# Patient Record
Sex: Female | Born: 1955 | Race: White | Hispanic: No | Marital: Single | Smoking: Never smoker
Health system: Southern US, Community
[De-identification: ages and names within clinical notes are randomized; demographics above are authoritative.]

## PROBLEM LIST (undated history)

## (undated) DIAGNOSIS — M1990 Unspecified osteoarthritis, unspecified site: Secondary | ICD-10-CM

## (undated) DIAGNOSIS — G709 Myoneural disorder, unspecified: Secondary | ICD-10-CM

## (undated) DIAGNOSIS — C4491 Basal cell carcinoma of skin, unspecified: Secondary | ICD-10-CM

## (undated) DIAGNOSIS — C801 Malignant (primary) neoplasm, unspecified: Secondary | ICD-10-CM

## (undated) DIAGNOSIS — R519 Headache, unspecified: Secondary | ICD-10-CM

## (undated) DIAGNOSIS — D649 Anemia, unspecified: Secondary | ICD-10-CM

## (undated) DIAGNOSIS — K219 Gastro-esophageal reflux disease without esophagitis: Secondary | ICD-10-CM

## (undated) DIAGNOSIS — R569 Unspecified convulsions: Secondary | ICD-10-CM

## (undated) DIAGNOSIS — Z8041 Family history of malignant neoplasm of ovary: Secondary | ICD-10-CM

## (undated) HISTORY — DX: Family history of malignant neoplasm of ovary: Z80.41

---

## 2004-03-08 ENCOUNTER — Encounter: Admission: RE | Admit: 2004-03-08 | Discharge: 2004-03-08 | Payer: Self-pay | Admitting: Family Medicine

## 2004-11-23 ENCOUNTER — Encounter: Admission: RE | Admit: 2004-11-23 | Discharge: 2004-11-23 | Payer: Self-pay | Admitting: Family Medicine

## 2004-12-22 ENCOUNTER — Other Ambulatory Visit: Admission: RE | Admit: 2004-12-22 | Discharge: 2004-12-22 | Payer: Self-pay | Admitting: Obstetrics and Gynecology

## 2008-03-09 ENCOUNTER — Encounter: Admission: RE | Admit: 2008-03-09 | Discharge: 2008-03-09 | Payer: Self-pay | Admitting: Family Medicine

## 2009-05-31 ENCOUNTER — Encounter: Admission: RE | Admit: 2009-05-31 | Discharge: 2009-05-31 | Payer: Self-pay | Admitting: Family Medicine

## 2010-06-04 ENCOUNTER — Encounter: Payer: Self-pay | Admitting: Family Medicine

## 2013-12-01 ENCOUNTER — Other Ambulatory Visit: Payer: Self-pay | Admitting: Family Medicine

## 2013-12-01 ENCOUNTER — Other Ambulatory Visit (HOSPITAL_COMMUNITY)
Admission: RE | Admit: 2013-12-01 | Discharge: 2013-12-01 | Disposition: A | Discharge status: Home / Self Care | Payer: BC Managed Care – PPO | Source: Ambulatory Visit | Attending: Family Medicine | Admitting: Family Medicine

## 2013-12-01 DIAGNOSIS — Z124 Encounter for screening for malignant neoplasm of cervix: Secondary | ICD-10-CM | POA: Insufficient documentation

## 2013-12-03 LAB — CYTOLOGY - PAP

## 2014-08-25 ENCOUNTER — Other Ambulatory Visit: Payer: Self-pay

## 2014-08-25 DIAGNOSIS — Z1231 Encounter for screening mammogram for malignant neoplasm of breast: Secondary | ICD-10-CM

## 2014-09-04 ENCOUNTER — Ambulatory Visit: Payer: BC Managed Care – PPO

## 2014-09-24 ENCOUNTER — Ambulatory Visit
Admission: RE | Admit: 2014-09-24 | Discharge: 2014-09-24 | Disposition: A | Discharge status: Home / Self Care | Payer: BC Managed Care – PPO | Source: Ambulatory Visit

## 2014-09-24 DIAGNOSIS — Z1231 Encounter for screening mammogram for malignant neoplasm of breast: Secondary | ICD-10-CM

## 2014-09-28 ENCOUNTER — Other Ambulatory Visit: Payer: Self-pay | Admitting: Family Medicine

## 2014-09-28 DIAGNOSIS — R928 Other abnormal and inconclusive findings on diagnostic imaging of breast: Secondary | ICD-10-CM

## 2014-10-05 ENCOUNTER — Ambulatory Visit
Admission: RE | Admit: 2014-10-05 | Discharge: 2014-10-05 | Disposition: A | Discharge status: Home / Self Care | Payer: BC Managed Care – PPO | Source: Ambulatory Visit | Attending: Family Medicine | Admitting: Family Medicine

## 2014-10-05 DIAGNOSIS — R928 Other abnormal and inconclusive findings on diagnostic imaging of breast: Secondary | ICD-10-CM

## 2015-04-14 ENCOUNTER — Other Ambulatory Visit: Payer: Self-pay | Admitting: Family Medicine

## 2015-04-14 DIAGNOSIS — R921 Mammographic calcification found on diagnostic imaging of breast: Secondary | ICD-10-CM

## 2015-04-29 ENCOUNTER — Ambulatory Visit
Admission: RE | Admit: 2015-04-29 | Discharge: 2015-04-29 | Disposition: A | Discharge status: Home / Self Care | Payer: BC Managed Care – PPO | Source: Ambulatory Visit | Attending: Family Medicine | Admitting: Family Medicine

## 2015-04-29 DIAGNOSIS — R921 Mammographic calcification found on diagnostic imaging of breast: Secondary | ICD-10-CM

## 2016-04-04 ENCOUNTER — Ambulatory Visit
Admission: RE | Admit: 2016-04-04 | Discharge: 2016-04-04 | Disposition: A | Discharge status: Home / Self Care | Payer: BC Managed Care – PPO | Source: Ambulatory Visit | Attending: Family Medicine | Admitting: Family Medicine

## 2016-04-04 ENCOUNTER — Other Ambulatory Visit: Payer: Self-pay | Admitting: Family Medicine

## 2016-04-04 DIAGNOSIS — M778 Other enthesopathies, not elsewhere classified: Secondary | ICD-10-CM

## 2016-04-04 DIAGNOSIS — M7582 Other shoulder lesions, left shoulder: Principal | ICD-10-CM

## 2016-05-09 ENCOUNTER — Other Ambulatory Visit: Payer: Self-pay | Admitting: Family Medicine

## 2016-05-09 ENCOUNTER — Ambulatory Visit
Admission: RE | Admit: 2016-05-09 | Discharge: 2016-05-09 | Disposition: A | Discharge status: Home / Self Care | Payer: BC Managed Care – PPO | Source: Ambulatory Visit | Attending: Family Medicine | Admitting: Family Medicine

## 2016-05-09 DIAGNOSIS — M25562 Pain in left knee: Secondary | ICD-10-CM

## 2017-01-09 ENCOUNTER — Other Ambulatory Visit: Payer: Self-pay | Admitting: Nurse Practitioner

## 2017-01-09 ENCOUNTER — Other Ambulatory Visit (HOSPITAL_COMMUNITY)
Admission: RE | Admit: 2017-01-09 | Discharge: 2017-01-09 | Disposition: A | Discharge status: Home / Self Care | Payer: BC Managed Care – PPO | Source: Ambulatory Visit | Attending: Nurse Practitioner | Admitting: Nurse Practitioner

## 2017-01-09 DIAGNOSIS — Z124 Encounter for screening for malignant neoplasm of cervix: Secondary | ICD-10-CM | POA: Insufficient documentation

## 2017-01-12 LAB — CYTOLOGY - PAP: HPV: NOT DETECTED

## 2017-01-31 ENCOUNTER — Other Ambulatory Visit: Payer: Self-pay | Admitting: Nurse Practitioner

## 2017-03-15 ENCOUNTER — Encounter: Payer: Self-pay | Admitting: Family Medicine

## 2017-03-20 ENCOUNTER — Ambulatory Visit: Payer: BC Managed Care – PPO | Admitting: Family Medicine

## 2017-03-20 DIAGNOSIS — G8929 Other chronic pain: Secondary | ICD-10-CM

## 2017-03-20 DIAGNOSIS — M25561 Pain in right knee: Secondary | ICD-10-CM | POA: Diagnosis not present

## 2017-03-20 DIAGNOSIS — M7741 Metatarsalgia, right foot: Secondary | ICD-10-CM | POA: Diagnosis not present

## 2017-03-20 DIAGNOSIS — M25562 Pain in left knee: Secondary | ICD-10-CM

## 2017-03-20 DIAGNOSIS — M7742 Metatarsalgia, left foot: Secondary | ICD-10-CM

## 2017-03-20 MED ORDER — METHYLPREDNISOLONE ACETATE 40 MG/ML IJ SUSP
40.0000 mg | Freq: Once | INTRAMUSCULAR | Status: AC
  Administered 2017-03-20: 40 mg via INTRA_ARTICULAR

## 2017-03-20 NOTE — Assessment & Plan Note (Signed)
Significant loss of transverse arch. We'll start her with some metatarsal pads see her back in 3-4 weeks.
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
## 2017-03-20 NOTE — Assessment & Plan Note (Signed)
I suspect she has some degenerative meniscal issues plus minus mild DJD. There is a report in her chart of tricompartmental arthritis of the right knee but I don't see any x-ray images. The left knee images revealed only some mild medial compartment narrowing We discussed options. We'll do corticosteroid injection left knee today and see her back 3-4 weeks. Use  the right knee as a control.

## 2017-04-24 ENCOUNTER — Ambulatory Visit: Payer: BC Managed Care – PPO | Admitting: Family Medicine

## 2017-05-15 DIAGNOSIS — Z9289 Personal history of other medical treatment: Secondary | ICD-10-CM

## 2017-05-15 HISTORY — DX: Personal history of other medical treatment: Z92.89

## 2017-10-03 ENCOUNTER — Other Ambulatory Visit: Payer: Self-pay | Admitting: Family Medicine

## 2017-10-03 DIAGNOSIS — R928 Other abnormal and inconclusive findings on diagnostic imaging of breast: Secondary | ICD-10-CM

## 2017-10-24 ENCOUNTER — Other Ambulatory Visit: Payer: BC Managed Care – PPO

## 2019-07-12 ENCOUNTER — Ambulatory Visit: Payer: BC Managed Care – PPO | Attending: Internal Medicine

## 2019-07-12 DIAGNOSIS — Z23 Encounter for immunization: Secondary | ICD-10-CM | POA: Insufficient documentation

## 2019-07-12 NOTE — Progress Notes (Signed)
   Covid-19 Vaccination Clinic  Name:  Melanie Reese    MRN: [id number] DOB: August 14, 1955  07/12/2019  Melanie Reese was observed post Covid-19 immunization for 15 minutes without incidence. She was provided with Vaccine Information Sheet and instruction to access the V-Safe system.   Melanie Reese was instructed to call 911 with any severe reactions post vaccine: Marland Kitchen Difficulty breathing  . Swelling of your face and throat  . A fast heartbeat  . A bad rash all over your body  . Dizziness and weakness    Immunizations Administered    Name Date Dose VIS Date Route   Pfizer COVID-19 Vaccine 07/12/2019  2:10 PM 0.3 mL 04/25/2019 Intramuscular   Manufacturer: [employer], [employer]   Lot: [id number]   NDC: [address]-3

## 2019-08-02 ENCOUNTER — Ambulatory Visit: Payer: BC Managed Care – PPO | Attending: Internal Medicine

## 2019-08-02 DIAGNOSIS — Z23 Encounter for immunization: Secondary | ICD-10-CM

## 2019-08-02 NOTE — Progress Notes (Signed)
   Covid-19 Vaccination Clinic  Name:  Melanie Reese    MRN: [id number] DOB: November 09, 1955  08/02/2019  Melanie Reese was observed post Covid-19 immunization for 15 minutes without incident. She was provided with Vaccine Information Sheet and instruction to access the V-Safe system.   Melanie Reese was instructed to call 911 with any severe reactions post vaccine: Marland Kitchen Difficulty breathing  . Swelling of face and throat  . A fast heartbeat  . A bad rash all over body  . Dizziness and weakness   Immunizations Administered    Name Date Dose VIS Date Route   Pfizer COVID-19 Vaccine 08/02/2019 10:05 AM 0.3 mL 04/25/2019 Intramuscular   Manufacturer: [employer], [employer]   Lot: [id number]   NDC: [address]-7

## 2020-05-25 ENCOUNTER — Other Ambulatory Visit: Payer: Self-pay | Admitting: Ophthalmology

## 2020-10-21 ENCOUNTER — Other Ambulatory Visit: Payer: Self-pay | Admitting: Family Medicine

## 2020-10-21 DIAGNOSIS — Z1231 Encounter for screening mammogram for malignant neoplasm of breast: Secondary | ICD-10-CM

## 2020-12-15 ENCOUNTER — Ambulatory Visit
Admission: RE | Admit: 2020-12-15 | Discharge: 2020-12-15 | Disposition: A | Discharge status: Home / Self Care | Payer: BC Managed Care – PPO | Source: Ambulatory Visit | Attending: Family Medicine | Admitting: Family Medicine

## 2020-12-15 ENCOUNTER — Other Ambulatory Visit: Payer: Self-pay

## 2020-12-15 DIAGNOSIS — Z1231 Encounter for screening mammogram for malignant neoplasm of breast: Secondary | ICD-10-CM

## 2023-01-29 ENCOUNTER — Other Ambulatory Visit: Payer: Self-pay | Admitting: Family Medicine

## 2023-01-29 DIAGNOSIS — M8580 Other specified disorders of bone density and structure, unspecified site: Secondary | ICD-10-CM

## 2023-01-29 IMAGING — MG MM DIGITAL SCREENING BILAT W/ TOMO AND CAD
8 series · 9 of 24 positions shown · non-contrast
Comparison: Previous exam(s).

CLINICAL DATA: Screening.

EXAM:
DIGITAL SCREENING BILATERAL MAMMOGRAM WITH TOMOSYNTHESIS AND CAD
TECHNIQUE: Bilateral screening digital craniocaudal and mediolateral oblique
mammograms were obtained. Bilateral screening digital breast
tomosynthesis was performed. The images were evaluated with
computer-aided detection.

[L MLO synth-2D]
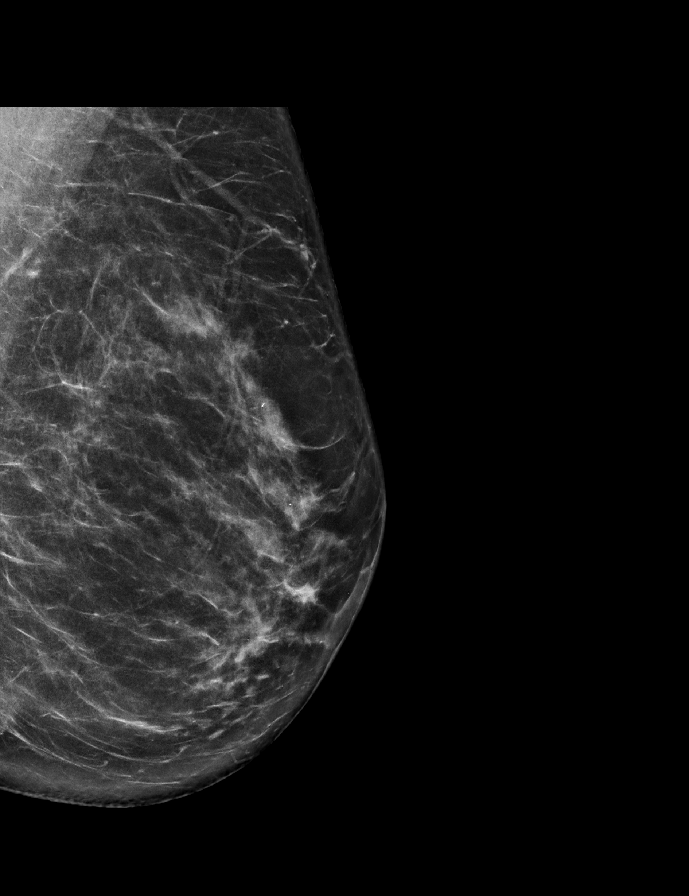

[L CC synth-2D]
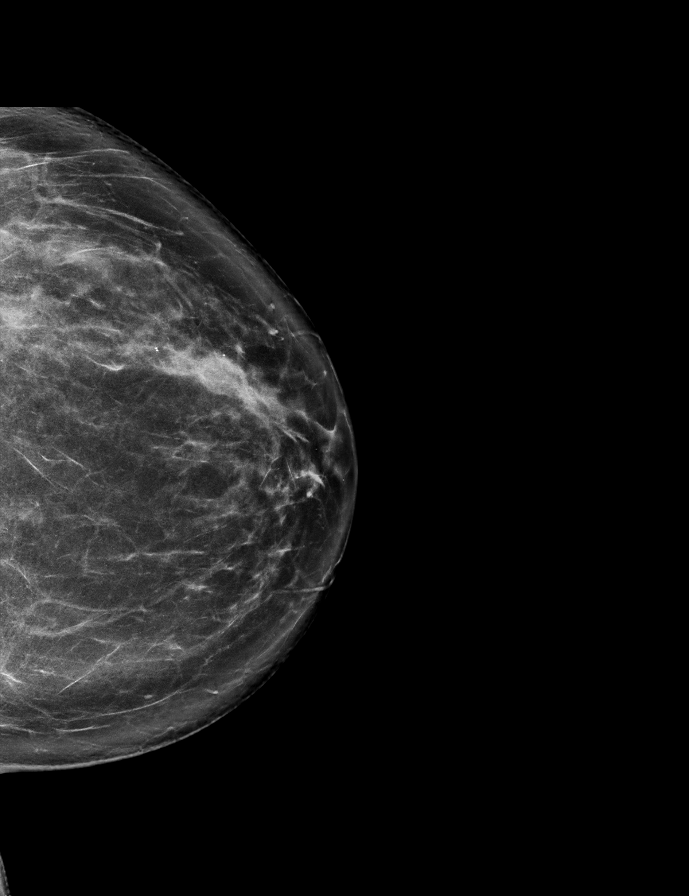

[R MLO synth-2D]
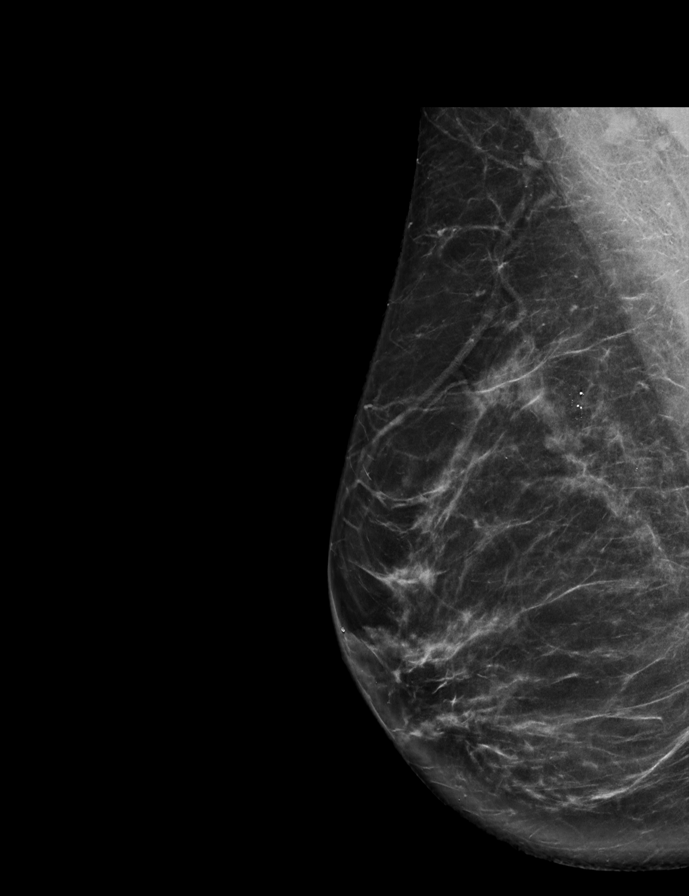

[R CC synth-2D]
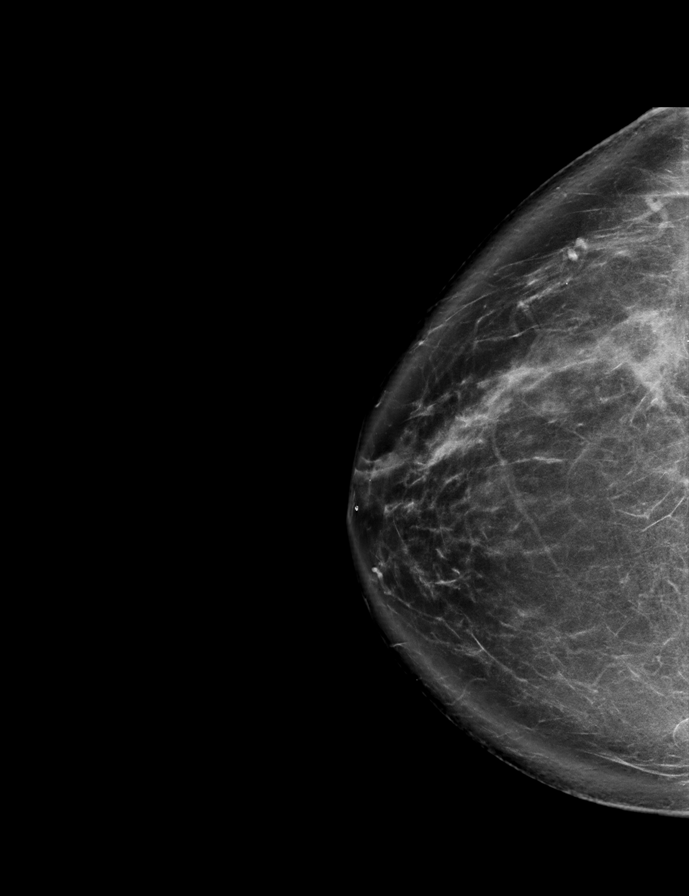

[R CC tomo · 2 of 86 frames shown]
[frame 28/86]
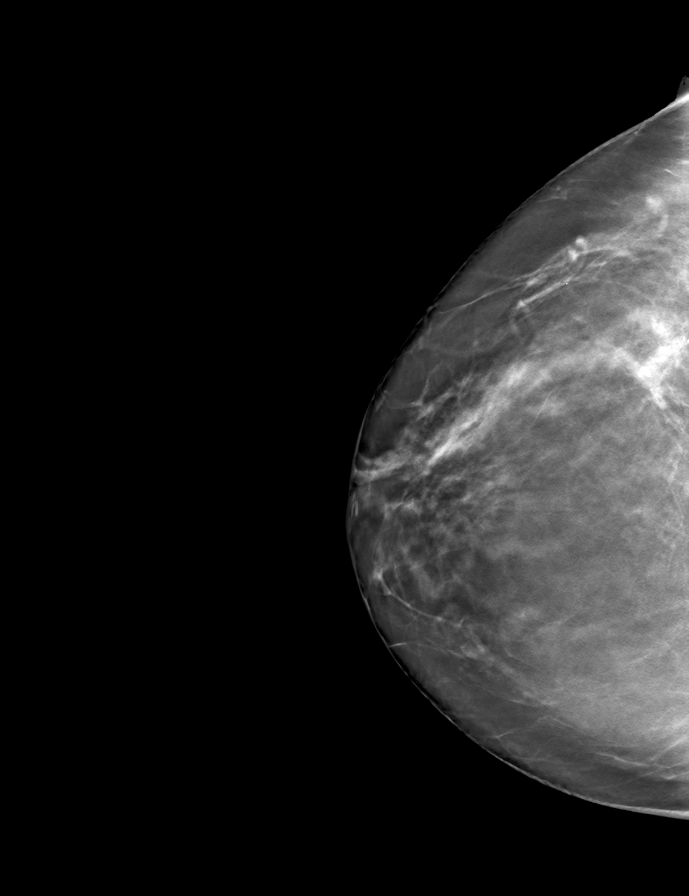
[frame 43/86]
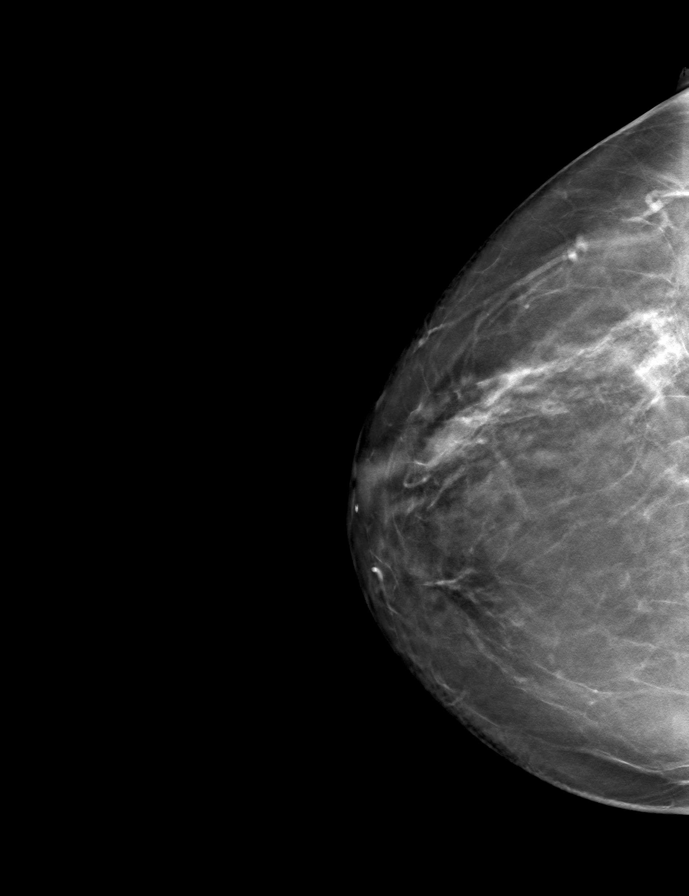

[L MLO tomo · tomo slice 37/73.0]
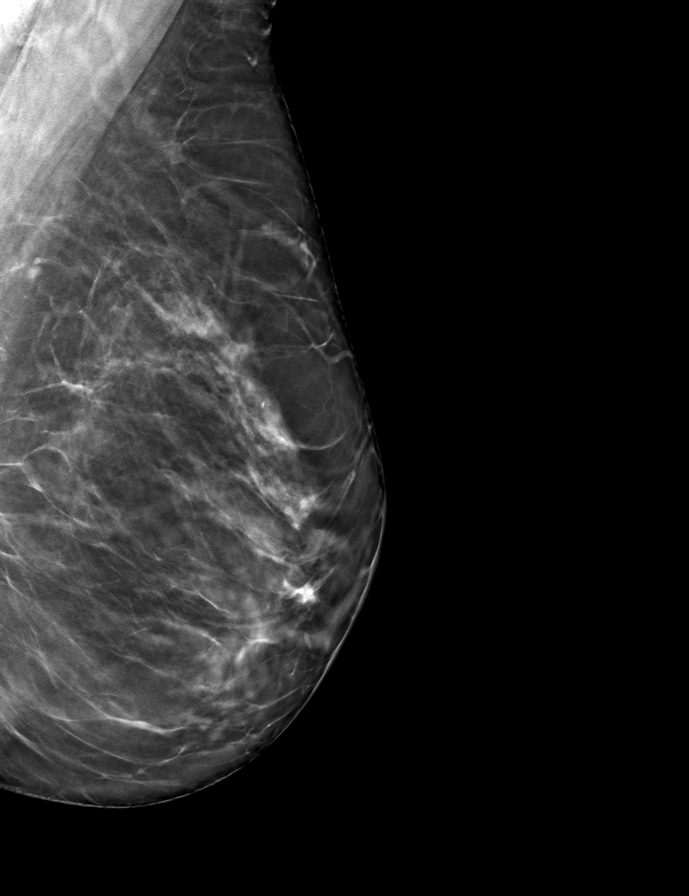

[R MLO tomo · tomo slice 39/76.0]
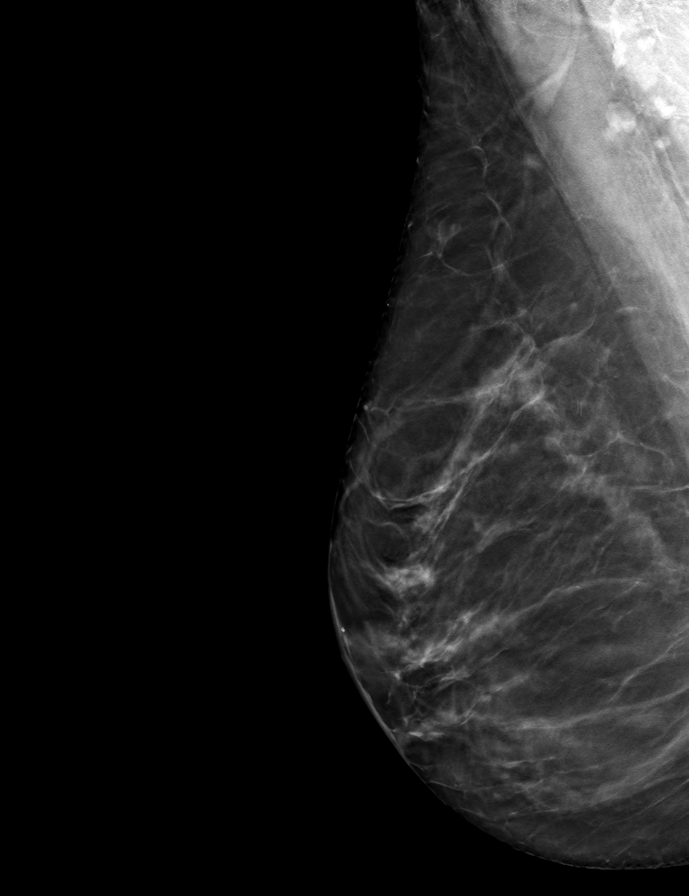

[L CC tomo · tomo slice 40/79.0]
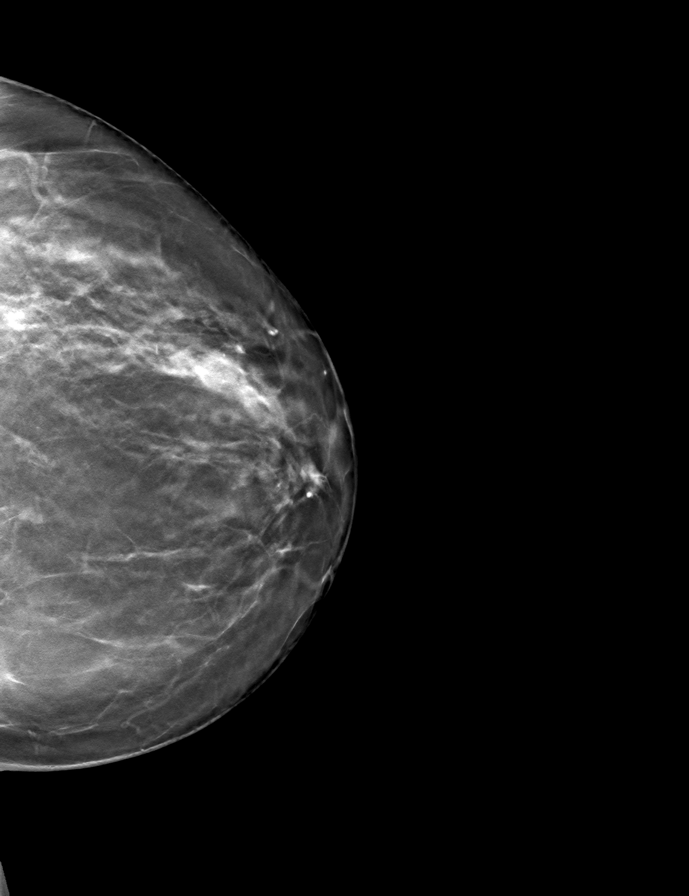

[9 of 24 positions shown; findings below may reference images not displayed]

ACR Breast Density Category b: There are scattered areas of
fibroglandular density.
FINDINGS: There are no findings suspicious for malignancy.
IMPRESSION: No mammographic evidence of malignancy. A result letter of this
screening mammogram will be mailed directly to the patient.

RECOMMENDATION:
Screening mammogram in one year. (Code:51-O-LD2)

BI-RADS CATEGORY  1: Negative.

## 2023-09-13 DIAGNOSIS — Z87891 Personal history of nicotine dependence: Secondary | ICD-10-CM | POA: Diagnosis not present

## 2023-09-13 DIAGNOSIS — G40909 Epilepsy, unspecified, not intractable, without status epilepticus: Secondary | ICD-10-CM | POA: Diagnosis not present

## 2023-09-13 DIAGNOSIS — K219 Gastro-esophageal reflux disease without esophagitis: Secondary | ICD-10-CM | POA: Diagnosis not present

## 2023-09-13 DIAGNOSIS — Z8249 Family history of ischemic heart disease and other diseases of the circulatory system: Secondary | ICD-10-CM | POA: Diagnosis not present

## 2023-09-13 DIAGNOSIS — R32 Unspecified urinary incontinence: Secondary | ICD-10-CM | POA: Diagnosis not present

## 2023-09-13 DIAGNOSIS — Z833 Family history of diabetes mellitus: Secondary | ICD-10-CM | POA: Diagnosis not present

## 2023-09-13 DIAGNOSIS — M1990 Unspecified osteoarthritis, unspecified site: Secondary | ICD-10-CM | POA: Diagnosis not present

## 2023-09-13 DIAGNOSIS — I10 Essential (primary) hypertension: Secondary | ICD-10-CM | POA: Diagnosis not present

## 2023-09-13 DIAGNOSIS — F325 Major depressive disorder, single episode, in full remission: Secondary | ICD-10-CM | POA: Diagnosis not present

## 2023-09-17 ENCOUNTER — Other Ambulatory Visit: Payer: BC Managed Care – PPO

## 2023-09-17 DIAGNOSIS — L820 Inflamed seborrheic keratosis: Secondary | ICD-10-CM | POA: Diagnosis not present

## 2023-09-17 DIAGNOSIS — Z85828 Personal history of other malignant neoplasm of skin: Secondary | ICD-10-CM | POA: Diagnosis not present

## 2023-09-17 DIAGNOSIS — L700 Acne vulgaris: Secondary | ICD-10-CM | POA: Diagnosis not present

## 2023-09-17 DIAGNOSIS — L570 Actinic keratosis: Secondary | ICD-10-CM | POA: Diagnosis not present

## 2023-09-20 DIAGNOSIS — M170 Bilateral primary osteoarthritis of knee: Secondary | ICD-10-CM | POA: Diagnosis not present

## 2023-09-20 DIAGNOSIS — M62838 Other muscle spasm: Secondary | ICD-10-CM | POA: Diagnosis not present

## 2023-09-20 DIAGNOSIS — M25519 Pain in unspecified shoulder: Secondary | ICD-10-CM | POA: Diagnosis not present

## 2023-10-25 DIAGNOSIS — M25561 Pain in right knee: Secondary | ICD-10-CM | POA: Diagnosis not present

## 2023-10-25 DIAGNOSIS — M25562 Pain in left knee: Secondary | ICD-10-CM | POA: Diagnosis not present

## 2023-10-25 DIAGNOSIS — M25511 Pain in right shoulder: Secondary | ICD-10-CM | POA: Diagnosis not present

## 2023-10-25 DIAGNOSIS — M25512 Pain in left shoulder: Secondary | ICD-10-CM | POA: Diagnosis not present

## 2023-10-30 DIAGNOSIS — M25511 Pain in right shoulder: Secondary | ICD-10-CM | POA: Diagnosis not present

## 2023-10-30 DIAGNOSIS — M25512 Pain in left shoulder: Secondary | ICD-10-CM | POA: Diagnosis not present

## 2023-10-30 DIAGNOSIS — M25562 Pain in left knee: Secondary | ICD-10-CM | POA: Diagnosis not present

## 2023-10-30 DIAGNOSIS — M25561 Pain in right knee: Secondary | ICD-10-CM | POA: Diagnosis not present

## 2023-11-01 DIAGNOSIS — M25511 Pain in right shoulder: Secondary | ICD-10-CM | POA: Diagnosis not present

## 2023-11-01 DIAGNOSIS — M25561 Pain in right knee: Secondary | ICD-10-CM | POA: Diagnosis not present

## 2023-11-01 DIAGNOSIS — M25562 Pain in left knee: Secondary | ICD-10-CM | POA: Diagnosis not present

## 2023-11-01 DIAGNOSIS — M25512 Pain in left shoulder: Secondary | ICD-10-CM | POA: Diagnosis not present

## 2023-11-06 DIAGNOSIS — M25512 Pain in left shoulder: Secondary | ICD-10-CM | POA: Diagnosis not present

## 2023-11-06 DIAGNOSIS — M25562 Pain in left knee: Secondary | ICD-10-CM | POA: Diagnosis not present

## 2023-11-06 DIAGNOSIS — M25561 Pain in right knee: Secondary | ICD-10-CM | POA: Diagnosis not present

## 2023-11-06 DIAGNOSIS — M25511 Pain in right shoulder: Secondary | ICD-10-CM | POA: Diagnosis not present

## 2023-11-08 DIAGNOSIS — M25562 Pain in left knee: Secondary | ICD-10-CM | POA: Diagnosis not present

## 2023-11-08 DIAGNOSIS — M25561 Pain in right knee: Secondary | ICD-10-CM | POA: Diagnosis not present

## 2023-11-08 DIAGNOSIS — M25512 Pain in left shoulder: Secondary | ICD-10-CM | POA: Diagnosis not present

## 2023-11-08 DIAGNOSIS — M25511 Pain in right shoulder: Secondary | ICD-10-CM | POA: Diagnosis not present

## 2023-11-13 DIAGNOSIS — M25561 Pain in right knee: Secondary | ICD-10-CM | POA: Diagnosis not present

## 2023-11-13 DIAGNOSIS — M25511 Pain in right shoulder: Secondary | ICD-10-CM | POA: Diagnosis not present

## 2023-11-13 DIAGNOSIS — M25562 Pain in left knee: Secondary | ICD-10-CM | POA: Diagnosis not present

## 2023-11-13 DIAGNOSIS — M25512 Pain in left shoulder: Secondary | ICD-10-CM | POA: Diagnosis not present

## 2023-11-15 DIAGNOSIS — M25512 Pain in left shoulder: Secondary | ICD-10-CM | POA: Diagnosis not present

## 2023-11-15 DIAGNOSIS — M25562 Pain in left knee: Secondary | ICD-10-CM | POA: Diagnosis not present

## 2023-11-15 DIAGNOSIS — M25561 Pain in right knee: Secondary | ICD-10-CM | POA: Diagnosis not present

## 2023-11-15 DIAGNOSIS — M25511 Pain in right shoulder: Secondary | ICD-10-CM | POA: Diagnosis not present

## 2023-12-27 DIAGNOSIS — M25561 Pain in right knee: Secondary | ICD-10-CM | POA: Diagnosis not present

## 2023-12-27 DIAGNOSIS — M25562 Pain in left knee: Secondary | ICD-10-CM | POA: Diagnosis not present

## 2024-01-22 DIAGNOSIS — L738 Other specified follicular disorders: Secondary | ICD-10-CM | POA: Diagnosis not present

## 2024-01-22 DIAGNOSIS — L570 Actinic keratosis: Secondary | ICD-10-CM | POA: Diagnosis not present

## 2024-01-22 DIAGNOSIS — L720 Epidermal cyst: Secondary | ICD-10-CM | POA: Diagnosis not present

## 2024-01-22 DIAGNOSIS — D225 Melanocytic nevi of trunk: Secondary | ICD-10-CM | POA: Diagnosis not present

## 2024-01-22 DIAGNOSIS — Z85828 Personal history of other malignant neoplasm of skin: Secondary | ICD-10-CM | POA: Diagnosis not present

## 2024-01-22 DIAGNOSIS — L821 Other seborrheic keratosis: Secondary | ICD-10-CM | POA: Diagnosis not present

## 2024-01-28 DIAGNOSIS — E538 Deficiency of other specified B group vitamins: Secondary | ICD-10-CM | POA: Diagnosis not present

## 2024-01-28 DIAGNOSIS — Z23 Encounter for immunization: Secondary | ICD-10-CM | POA: Diagnosis not present

## 2024-01-28 DIAGNOSIS — E785 Hyperlipidemia, unspecified: Secondary | ICD-10-CM | POA: Diagnosis not present

## 2024-01-28 DIAGNOSIS — J301 Allergic rhinitis due to pollen: Secondary | ICD-10-CM | POA: Diagnosis not present

## 2024-01-28 DIAGNOSIS — G40909 Epilepsy, unspecified, not intractable, without status epilepticus: Secondary | ICD-10-CM | POA: Diagnosis not present

## 2024-01-28 DIAGNOSIS — M8588 Other specified disorders of bone density and structure, other site: Secondary | ICD-10-CM | POA: Diagnosis not present

## 2024-01-28 DIAGNOSIS — E559 Vitamin D deficiency, unspecified: Secondary | ICD-10-CM | POA: Diagnosis not present

## 2024-01-28 DIAGNOSIS — R600 Localized edema: Secondary | ICD-10-CM | POA: Diagnosis not present

## 2024-01-28 DIAGNOSIS — M1711 Unilateral primary osteoarthritis, right knee: Secondary | ICD-10-CM | POA: Diagnosis not present

## 2024-01-28 DIAGNOSIS — J300 Vasomotor rhinitis: Secondary | ICD-10-CM | POA: Diagnosis not present

## 2024-01-28 DIAGNOSIS — Z0000 Encounter for general adult medical examination without abnormal findings: Secondary | ICD-10-CM | POA: Diagnosis not present

## 2024-02-12 ENCOUNTER — Other Ambulatory Visit: Payer: Self-pay | Admitting: Family Medicine

## 2024-02-12 DIAGNOSIS — N6310 Unspecified lump in the right breast, unspecified quadrant: Secondary | ICD-10-CM

## 2024-02-20 ENCOUNTER — Other Ambulatory Visit: Payer: Self-pay | Admitting: Family Medicine

## 2024-02-20 ENCOUNTER — Ambulatory Visit
Admission: RE | Admit: 2024-02-20 | Discharge: 2024-02-20 | Disposition: A | Payer: Self-pay | Source: Ambulatory Visit | Attending: Family Medicine | Admitting: Family Medicine

## 2024-02-20 ENCOUNTER — Ambulatory Visit
Admission: RE | Admit: 2024-02-20 | Discharge: 2024-02-20 | Disposition: A | Source: Ambulatory Visit | Attending: Family Medicine | Admitting: Family Medicine

## 2024-02-20 DIAGNOSIS — N6310 Unspecified lump in the right breast, unspecified quadrant: Secondary | ICD-10-CM

## 2024-02-20 DIAGNOSIS — N6311 Unspecified lump in the right breast, upper outer quadrant: Secondary | ICD-10-CM

## 2024-02-20 DIAGNOSIS — R928 Other abnormal and inconclusive findings on diagnostic imaging of breast: Secondary | ICD-10-CM | POA: Diagnosis not present

## 2024-02-22 ENCOUNTER — Ambulatory Visit
Admission: RE | Admit: 2024-02-22 | Discharge: 2024-02-22 | Disposition: A | Discharge status: Home / Self Care | Source: Ambulatory Visit | Attending: Family Medicine | Admitting: Family Medicine

## 2024-02-22 DIAGNOSIS — N6311 Unspecified lump in the right breast, upper outer quadrant: Secondary | ICD-10-CM

## 2024-02-22 DIAGNOSIS — C50411 Malignant neoplasm of upper-outer quadrant of right female breast: Secondary | ICD-10-CM | POA: Diagnosis not present

## 2024-02-22 HISTORY — PX: BREAST BIOPSY: SHX20

## 2024-02-25 LAB — SURGICAL PATHOLOGY

## 2024-02-28 ENCOUNTER — Other Ambulatory Visit: Payer: Self-pay | Admitting: General Surgery

## 2024-02-28 DIAGNOSIS — Z170 Estrogen receptor positive status [ER+]: Secondary | ICD-10-CM

## 2024-02-29 ENCOUNTER — Other Ambulatory Visit: Payer: Self-pay | Admitting: General Surgery

## 2024-02-29 DIAGNOSIS — Z809 Family history of malignant neoplasm, unspecified: Secondary | ICD-10-CM | POA: Diagnosis not present

## 2024-02-29 DIAGNOSIS — C50411 Malignant neoplasm of upper-outer quadrant of right female breast: Secondary | ICD-10-CM | POA: Diagnosis not present

## 2024-02-29 DIAGNOSIS — Z170 Estrogen receptor positive status [ER+]: Secondary | ICD-10-CM | POA: Diagnosis not present

## 2024-03-03 NOTE — Therapy (Signed)
 OUTPATIENT PHYSICAL THERAPY BREAST CANCER BASELINE EVALUATION   Patient Name: Melanie Reese MRN: [id number] DOB:Oct 11, 1955, 68 y.o., female Today's Date: 03/04/2024  END OF SESSION:  PT End of Session - 03/04/24 0944     Visit Number 1    Number of Visits 2    Date for Recertification  04/15/24    Authorization Type auth required    PT Start Time 0805    PT Stop Time 0842    PT Time Calculation (min) 37 min    Activity Tolerance Patient tolerated treatment well    Behavior During Therapy [facility] for tasks assessed/performed          History reviewed. No pertinent past medical history. Past Surgical History:  Procedure Laterality Date   BREAST BIOPSY Right 02/22/2024   US  RT BREAST BX W LOC DEV 1ST LESION IMG BX SPEC US  GUIDE 02/22/2024 [facility] MAMMOGRAPHY   Patient Active Problem List   Diagnosis Date Noted   Metatarsalgia of both feet 03/20/2017   Knee pain, bilateral 03/20/2017    PCP: Montie Pizza, MD  REFERRING PROVIDER: Jina Nephew, MD  REFERRING DIAG: C50.411,Z17.0 (ICD-10-CM) - Malignant neoplasm of upper-outer quadrant of right breast in female, estrogen receptor positive (HCC)   THERAPY DIAG:  Malignant neoplasm of upper-outer quadrant of right breast in female, estrogen receptor positive (HCC)  Abnormal posture  Rationale for Evaluation and Treatment: Rehabilitation  ONSET DATE: 02/25/2024  SUBJECTIVE:                                                                                                                                                                                           SUBJECTIVE STATEMENT: Patient reports she is here today to be seen for her newly diagnosed right breast cancer. She will be having a lumpectomy on 03/25/24 with sentinel lymph node biopsy. Patient believes she will have radiation following but has not met with the radiation oncologist. She reports she had previous PT for both shoulders, they will occasionally bother her but  are not currently bothering her.   PERTINENT HISTORY:  Patient was diagnosed on 02/25/2024 with right grade 2 invasive ductal carcinoma. It measures 2.6 cm and is located in the upper-outer quadrant. It is ER/PR +, HER2 - with a Ki67 of 30%.   PATIENT GOALS:   reduce lymphedema risk and learn post op HEP.   PAIN:  Are you having pain? No - a bit sore from the biopsy  PRECAUTIONS: Active CA   RED FLAGS: None   HAND DOMINANCE: right  WEIGHT BEARING RESTRICTIONS: No  FALLS:  Has patient fallen in last 6 months? 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  COGNITION: Overall cognitive status: Within functional limits for tasks assessed    POSTURE:  Forward head and rounded shoulders posture  UPPER EXTREMITY AROM/PROM:  A/PROM RIGHT   eval   Shoulder extension 63  Shoulder flexion 152  Shoulder abduction 145  Shoulder internal rotation 54  Shoulder external rotation 75    (Blank rows = not tested)  A/PROM LEFT   eval  Shoulder extension 52  Shoulder flexion 152  Shoulder abduction 156  Shoulder internal rotation 61  Shoulder external rotation 81    (Blank rows = not tested)  CERVICAL AROM: All within normal limits:    Percent limited  Flexion WFL  Extension WFL  Right lateral flexion WFL  Left lateral flexion WFL  Right rotation WFL  Left rotation WFL    LYMPHEDEMA ASSESSMENTS (in cm):   LANDMARK RIGHT   eval  10 cm proximal to olecranon process from proximal aspect of olecranon 30.1  Olecranon process 24.1  10 cm proximal to ulnar styloid process from proximal aspect of styloid process 21.7  Just  distal to ulnar styloid process 15.4  Across hand at thumb web space 18.6  At base of 2nd digit 6.5  (Blank rows = not tested)  LANDMARK LEFT   eval  10 cm proximal to olecranon process from proximal aspect of olecranon 30.9   Olecranon process 23.8  10 cm proximal to ulnar styloid process from proximal aspect of styloid process 20.7  Just distal to ulnar styloid process 15.6  Across hand at thumb web space 17.5  At base of 2nd digit 6.1  (Blank rows = not tested)  L-DEX LYMPHEDEMA SCREENING: The patient was assessed using the L-Dex machine today to produce a lymphedema index baseline score. The patient will be reassessed on a regular basis (typically every 3 months) to obtain new L-Dex scores. If the score is > 6.5 points away from his/her baseline score indicating onset of subclinical lymphedema, it will be recommended to wear a compression garment for 4 weeks, 12 hours per day and then be reassessed. If the score continues to be > 6.5 points from baseline at reassessment, we will initiate lymphedema treatment. Assessing in this manner has a 95% rate of preventing clinically significant lymphedema.   L-DEX FLOWSHEETS - 03/04/24 0800       L-DEX LYMPHEDEMA SCREENING   Measurement Type Unilateral    L-DEX MEASUREMENT EXTREMITY Upper Extremity    POSITION  Standing    DOMINANT SIDE Right    At Risk Side Right    BASELINE SCORE (UNILATERAL) -1.1          QUICK DASH SURVEY: 9.09  PATIENT EDUCATION:  Education details: Time spent educating patient on aspects of self-care to maximize post op recovery. Patient was educated on where and how to get a post op compression bra to use to reduce post op edema. Patient was also educated on the use of SOZO screenings and surveillance principles for early identification of lymphedema onset. She was instructed to use the post op pillow in the axilla for pressure and pain relief. Patient educated on lymphedema risk reduction and post op  shoulder/posture HEP. Person  educated: Patient Education method: Explanation, Demonstration, Handout Education comprehension: Patient verbalized understanding and returned demonstration  HOME EXERCISE PROGRAM: Patient was instructed today in a home exercise program today for post op shoulder range of motion. These included active assist shoulder flexion in sitting, scapular retraction, wall walking with shoulder abduction, and hands behind head external rotation.  She was encouraged to do these twice a day, holding 3 seconds and repeating 5 times when permitted by her physician.  ASSESSMENT:  CLINICAL IMPRESSION: The patient is a 68 year old female presenting to outpatient physical therapy following a diagnosis of right grade 2 invasive ductal carcinoma. Baseline shoulder AROM measurements were taken with ROM deficits noted on the Rt side due to biopsy performed. Baseline lymphedema circumference measurements are obtained bilaterally, as well as SOZO screening. She is planning to have lumpectomy with sentinel lymph node biopsy on 03/25/24 with possible radiation following. At the initial evaluation, patient demonstrates decreased shoulder AROM on the right side compared to the left. The patient will benefit from a post op PT reassessment to determine needs and from L-Dex screens every 3 months for 2 years to detect subclinical lymphedema. Patient education is provided regarding post-op exercises, lymphedema, SOZO screening, and anatomy.  Pt will benefit from skilled therapeutic intervention to improve on the following deficits: Decreased knowledge of precautions, impaired UE functional use, pain, decreased ROM, postural dysfunction.   PT treatment/interventions: ADL/self-care home management, pt/family education, therapeutic exercise  REHAB POTENTIAL: Excellent  CLINICAL DECISION MAKING: Stable/uncomplicated  EVALUATION COMPLEXITY: Low   GOALS: Goals reviewed with patient? YES  LONG TERM  GOALS: (STG=LTG)    Name Target Date Goal status  1 Pt will be able to verbalize understanding of pertinent lymphedema risk reduction practices relevant to her dx specifically related to skin care.  Baseline:  No knowledge 03/04/2024 Achieved at eval  2 Pt will be able to return demo and/or verbalize understanding of the post op HEP related to regaining shoulder ROM. Baseline:  No knowledge 03/04/2024 Achieved at eval  3 Pt will be able to verbalize understanding of the importance of viewing the post op After Breast CA Class video for further lymphedema risk reduction education and therapeutic exercise.  Baseline:  No knowledge 03/04/2024 Achieved at eval  4 Pt will demo she has regained full shoulder ROM and function post operatively compared to baselines.  Baseline: See objective measurements taken today. 04/15/2024     PLAN:  PT FREQUENCY/DURATION: EVAL and 1 follow up appointment.   PLAN FOR NEXT SESSION: will reassess 3-4 weeks post op to determine needs.   Patient will follow up at outpatient cancer rehab 3-4 weeks following surgery.  If the patient requires physical therapy at that time, a specific plan will be dictated and sent to the referring physician for approval. The patient was educated today on appropriate basic range of motion exercises to begin post operatively and the importance of viewing the After Breast Cancer class video following surgery.  Patient was educated today on lymphedema risk reduction practices as it pertains to recommendations that will benefit the patient immediately following surgery.  She verbalized good understanding.    Physical Therapy Information for After Breast Cancer Surgery/Treatment:  Lymphedema is a swelling condition that you may be at risk for in your arm if you have lymph nodes removed from the armpit area.  After a sentinel node biopsy, the risk is approximately 5-9% and is higher after an axillary node dissection.  There is treatment  available for this  condition and it is not life-threatening.  Contact your physician or physical therapist with concerns. You may begin the 4 shoulder/posture exercises (see additional sheet) when permitted by your physician (typically a week after surgery).  If you have drains, you may need to wait until those are removed before beginning range of motion exercises.  A general recommendation is to not lift your arms above shoulder height until drains are removed.  These exercises should be done to your tolerance and gently.  This is not a no pain/no gain type of recovery so listen to your body and stretch into the range of motion that you can tolerate, stopping if you have pain.  If you are having immediate reconstruction, ask your plastic surgeon about doing exercises as he or she may want you to wait. We encourage you to view the After Breast Cancer class video following surgery.  You will learn information related to lymphedema risk, prevention and treatment and additional exercises to regain mobility following surgery.   While undergoing any medical procedure or treatment, try to avoid blood pressure being taken or needle sticks from occurring on the arm on the side of cancer.   This recommendation begins after surgery and continues for the rest of your life.  This may help reduce your risk of getting lymphedema (swelling in your arm). An excellent resource for those seeking information on lymphedema is the National Lymphedema Network's web site. It can be accessed at www.lymphnet.org If you notice swelling in your hand, arm or breast at any time following surgery (even if it is many years from now), please contact your doctor or physical therapist to discuss this.  Lymphedema can be treated at any time but it is easier for you if it is treated early on.  If you feel like your shoulder motion is not returning to normal in a reasonable amount of time, please contact your surgeon or physical  therapist.  [facility] [phone]. [address], [address], [place] [address]  [facility] After Breast Cancer Class  After Breast Cancer Class is a specially designed exercise class video to assist you in a safe recover after having breast cancer surgery.  In this video you will learn how to get back to full function whether your drains were just removed or if you had surgery a month ago. The video can be viewed on this page: [email or web address]-[email or web address] or on YouTube here: [email or web address].  Class Goals  Understand specific stretches to improve the flexibility of you chest and shoulder. Learn ways to safely strengthen your upper body and improve your posture. Understand the warning signs of infection and why you may be at risk for an arm infection. Learn about Lymphedema and prevention.  ** You do not need to view this video until after surgery.  Drains should be removed to participate in the recommended exercises on the video.  Patient was instructed today in a home exercise program today for post op shoulder range of motion. These included active assist shoulder flexion in sitting, scapular retraction, wall walking with shoulder abduction, and hands behind head external rotation.  She was encouraged to do these twice a day, holding 3 seconds and repeating 5 times when permitted by her physician.    Randall Pack, SPT 03/04/2024, 9:49 AM

## 2024-03-04 ENCOUNTER — Ambulatory Visit: Attending: General Surgery | Admitting: Rehabilitation

## 2024-03-04 ENCOUNTER — Other Ambulatory Visit: Payer: Self-pay

## 2024-03-04 ENCOUNTER — Encounter: Payer: Self-pay | Admitting: Rehabilitation

## 2024-03-04 DIAGNOSIS — R293 Abnormal posture: Secondary | ICD-10-CM | POA: Diagnosis not present

## 2024-03-04 DIAGNOSIS — C50411 Malignant neoplasm of upper-outer quadrant of right female breast: Secondary | ICD-10-CM | POA: Diagnosis not present

## 2024-03-04 DIAGNOSIS — Z170 Estrogen receptor positive status [ER+]: Secondary | ICD-10-CM | POA: Insufficient documentation

## 2024-03-05 ENCOUNTER — Telehealth: Payer: Self-pay | Admitting: Radiation Oncology

## 2024-03-05 NOTE — Telephone Encounter (Signed)
 LVM to schedule consult with Dr. Maritza to discuss XRT.

## 2024-03-11 DIAGNOSIS — C50411 Malignant neoplasm of upper-outer quadrant of right female breast: Secondary | ICD-10-CM | POA: Insufficient documentation

## 2024-03-11 NOTE — Progress Notes (Signed)
 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 female seen in consultation for radiation therapy.  She initially presented after palpating a mass in her breast for two weeks. She underwent a diagnostic bilateral mammogram on 02/20/24 demonstrating a highly suspicious 2.6 cm R breast mass at 10:300, 5 cm from the nipple. This location corresponded to palpable abnormality. There was no concerning R axillary adenopathy and no evidence of a L breast malignancy.   She underwent biopsy with clip placement on 02/22/24 demonstrating invasive ductal carcinoma, grade 2, - LVSI. ER+ (100%), PR+ (2%) and Her2- (1+) by IHC. Ki67 was 30%.  She has seen Dr. Aron to discuss surgery. They are planning breast conservation with lumpectomy followed by oncotype, radiation and antihormonal therapy. Surgery tentatively scheduled for 03/25/2024.  Reports her breast is still sore and bruised from the biopsy which was quite painful. Otherwise she is doing quite well and generally healthy.  She is a retired Midwife. She has two daughters and multiple grand daughters. The closest daughter lives in [place] near [place].  Estrogen exposure history: OCPs  Childbearing/breastfeeding: 2 children  Family hx of cancer: maternal aunt with ovarian cancer  PREVIOUS RADIATION THERAPY: No  AUTOIMMUNE DISEASE: No  MEDICAL DEVICES: No  PREGNANCY: No   PAST MEDICAL HISTORY: History reviewed. No pertinent past medical history.     PAST SURGICAL HISTORY: Past Surgical History:  Procedure Laterality Date   BREAST  BIOPSY Right 02/22/2024   US  RT BREAST BX W LOC DEV 1ST LESION IMG BX SPEC US  GUIDE 02/22/2024 [facility] MAMMOGRAPHY     FAMILY HISTORY:  Family History  Problem Relation Age of Onset   Ovarian cancer Maternal Aunt    Bone cancer Maternal Grandfather    Breast cancer Neg Hx      SOCIAL HISTORY:     ALLERGIES: Doxycycline and Sulfa antibiotics   MEDICATIONS:  Current Outpatient Medications  Medication Sig Dispense Refill   acetaminophen (TYLENOL) 500 MG tablet 500 mg. Prn     Cholecalciferol 125 MCG (5000 UT) TABS Take 5,000 Units by mouth.     cyanocobalamin 2000 MCG tablet Take 2,000 mcg by mouth.     fluticasone (FLONASE) 50 MCG/ACT nasal spray 1 spray.     PHENobarbital (LUMINAL) 97.2 MG tablet Take 97.2 mg by mouth at bedtime.     triamterene-hydrochlorothiazide (MAXZIDE-25) 37.5-25 MG tablet TAKE 1 TABLET BY MOUTH EVERY MORNING AS NEEDED FOR SWELLING 30     No current facility-administered medications for this encounter.    REVIEW OF SYSTEMS: As per HPI      PHYSICAL EXAM:  Wt Readings from Last 3 Encounters:  03/13/24 156 lb 6.4 oz (70.9 kg)  03/12/24 160 lb 6.4 oz (72.8 kg)  03/20/17 142 lb (64.4 kg)   Temp Readings from Last 3 Encounters:  03/13/24 99 F (37.2 C) (Oral)  03/12/24 98.5 F (36.9 C) (Temporal)   BP Readings from Last 3 Encounters:  03/13/24 138/82  03/12/24 (!) 145/95  03/20/17 130/86   Pulse Readings from Last 3 Encounters:  03/13/24 84  03/12/24 78   Pain Assessment Pain Score: 0-No pain/10   Physical Exam Vitals and nursing note reviewed.  Constitutional:      General: She is not in acute distress. HENT:     Head: Normocephalic and atraumatic.  Eyes:     Extraocular Movements: Extraocular movements intact.  Cardiovascular:     Rate and Rhythm: Normal rate.  Pulmonary:     Effort: Pulmonary effort is normal. No respiratory distress.  Abdominal:     General: There is no distension.  Musculoskeletal:        General:  Normal range of motion.     Cervical back: Normal range of motion.  Skin:    Coloration: Skin is not pale.  Neurological:     General: No focal deficit present.     Mental Status: She is alert.     Gait: Gait normal.  Psychiatric:        Mood and Affect: Mood normal.      ECOG = 0   LABORATORY DATA:  No results found for: WBC, HGB, HCT, MCV, PLT No results found for: NA, K, CL, CO2 No results found for: ALT, AST, GGT, ALKPHOS, BILITOT    RADIOGRAPHY: US  RT BREAST BX W LOC DEV 1ST LESION IMG BX SPEC US  GUIDE Addendum Date: 02/29/2024 ADDENDUM REPORT: 02/29/2024 08:14 ADDENDUM: PATHOLOGY revealed: 1. Breast, right, needle core biopsy, 10:30 6 cmfn- INVASIVE DUCTAL CARCINOMA DUCTAL CARCINOMA IN SITU, SOLID TYPE, INTERMEDIATE NUCLEAR GRADE- OVERALL GRADE: 2, LYMPHOVASCULAR INVASION: NOT IDENTIFIED, CANCER LENGTH: 8 MM/ 0.8 CM CALCIFICATIONS: NOT IDENTIFIED Pathology results are CONCORDANT with imaging findings, per Dr. Rosina Gelineau. Pathology results and recommendations were discussed with patient via telephone on 02/25/2024 by Rock Hover RN. Patient reported biopsy site doing well with no adverse symptoms, and only slight tenderness at the site. Post biopsy care instructions were reviewed, questions were answered and my direct phone number was provided. Patient was instructed to call [facility] for any additional questions or concerns related to biopsy site. RECOMMENDATION: Surgical and oncological consultation. Request for surgical consultation relayed to [facility] and Olam Bunnell at [facility] on 02/25/2024 by Rock Hover RN. Patient has scheduled appointment with Dr. PHEBE Nephew on 02/29/24 at 11:30 arrive at 11. Pt is aware. Request for oncology consultation relayed to nurse navigators at [facility] on 02/25/2024 by Rock Hover RN. Patient elected not to do MDC since she was able to see a breast surgeon this week. Pathology  results reported by Rock Hover RN 02/25/2024. Electronically Signed   By: Rosina Gelineau M.D.   On: 02/29/2024 08:14   Result Date: 02/29/2024 CLINICAL DATA:  69 year old female with a 2.6 cm palpable spiculated mass with pleomorphic calcifications in the right breast at 10:30 6 cm from the nipple presents for ultrasound-guided biopsy. EXAM: ULTRASOUND GUIDED RIGHT BREAST CORE NEEDLE BIOPSY COMPARISON:  Previous exam(s). PROCEDURE: I met with the patient and we discussed the procedure of ultrasound-guided biopsy, including benefits and alternatives. We discussed the high likelihood of a successful procedure. We discussed the risks of the procedure, including infection, bleeding, tissue injury, clip migration, and inadequate sampling. Informed written consent was given. The usual time-out protocol was performed immediately prior to the procedure. Lesion quadrant: Upper outer quadrant Using sterile technique and 1% Lidocaine with and without epinephrine as local anesthetic, under direct ultrasound visualization, a 14 gauge spring-loaded device was used to perform biopsy of a 2.6 cm spiculated palpable mass in the right breast at  10:30 6 cm from the nipple using a caudal cranial approach. At the conclusion of the procedure heart shaped tissue marker clip was deployed into the biopsy cavity. Follow up 2 view mammogram was performed and dictated separately. IMPRESSION: Ultrasound guided biopsy of a 2.6 cm mass with pleomorphic calcifications in the right breast at 10:30 posterior depth with placement of a heart clip. No apparent complications. Electronically Signed: By: Rosina Gelineau M.D. On: 02/22/2024 08:19   MM CLIP PLACEMENT RIGHT Result Date: 02/22/2024 CLINICAL DATA:  68 year old female status post ultrasound-guided biopsy of a 2.6 cm spiculated mass in the upper outer right breast with placement of a heart clip CLINICAL EXAM: 3D DIAGNOSTIC RIGHT MAMMOGRAM POST ULTRASOUND BIOPSY COMPARISON:  Previous  exam(s). ACR Breast Density Category b: There are scattered areas of fibroglandular density. FINDINGS: 3D Mammographic images were obtained following ultrasound guided biopsy of a 2.6 cm spiculated mass in the right breast at 10:30 6 cm from the nipple with placement of a heart clip. The biopsy marking clip is in expected position at the site of biopsy. IMPRESSION: Appropriate positioning of the heart shaped biopsy marking clip at the site of biopsy in the right breast 10:30 posterior depth. Final Assessment: Post Procedure Mammograms for Marker Placement Electronically Signed   By: Rosina Gelineau M.D.   On: 02/22/2024 08:31   MM 3D DIAGNOSTIC MAMMOGRAM BILATERAL BREAST Result Date: 02/20/2024 CLINICAL DATA:  68 year old woman with palpable RIGHT breast mass for 2 weeks presents for annual BILATERAL mammogram. EXAM: DIGITAL DIAGNOSTIC BILATERAL MAMMOGRAM WITH TOMOSYNTHESIS AND CAD; ULTRASOUND RIGHT BREAST LIMITED TECHNIQUE: Bilateral digital diagnostic mammography and breast tomosynthesis was performed. The images were evaluated with computer-aided detection. ; Targeted ultrasound examination of the right breast was performed COMPARISON:  Previous exam(s). ACR Breast Density Category b: There are scattered areas of fibroglandular density. FINDINGS: RIGHT: Mammogram: Oval mass with indistinct and spiculated margins and pleomorphic calcifications seen in the upper-outer quadrant of the RIGHT breast at posterior depth. Ultrasound: Targeted sonographic evaluation of the RIGHT breast was performed. 2.6 x 1.8 x 2.4 cm oval hypoechoic mass with indistinct margins is seen at 10:30 6 CMFN. It contains calcifications and correlates to the mass seen on mammogram. No enlarged RIGHT axillary lymph nodes are present. LEFT: Mammogram: No suspicious mass, distortion, or microcalcifications are identified to suggest presence of malignancy. IMPRESSION: 1. Highly suspicious 2.6 cm RIGHT breast mass (10:30 6 CMFN), corresponding to  the palpable abnormality and mammographic mass. 2. No evidence of LEFT breast malignancy. RECOMMENDATION: Ultrasound-guided biopsy of 2.6 cm RIGHT breast mass (10:30 6 CMFN). I have discussed the findings and recommendations with the patient. The biopsy procedure was explained to the patient and questions were answered. Patient expressed their understanding of the biopsy recommendation. Patient will be scheduled for biopsy at her earliest convenience by the schedulers. Ordering provider will be notified. If applicable, a reminder letter will be sent to the patient regarding the next appointment. BI-RADS CATEGORY  5: Highly suggestive of malignancy. Electronically Signed   By: Aliene Lloyd M.D.   On: 02/20/2024 12:21   US  LIMITED ULTRASOUND INCLUDING AXILLA RIGHT BREAST Result Date: 02/20/2024 CLINICAL DATA:  68 year old woman with palpable RIGHT breast mass for 2 weeks presents for annual BILATERAL mammogram. EXAM: DIGITAL DIAGNOSTIC BILATERAL MAMMOGRAM WITH TOMOSYNTHESIS AND CAD; ULTRASOUND RIGHT BREAST LIMITED TECHNIQUE: Bilateral digital diagnostic mammography and breast tomosynthesis was performed. The images were evaluated with computer-aided detection. ; Targeted ultrasound examination of the right breast was performed COMPARISON:  Previous exam(s).  ACR Breast Density Category b: There are scattered areas of fibroglandular density. FINDINGS: RIGHT: Mammogram: Oval mass with indistinct and spiculated margins and pleomorphic calcifications seen in the upper-outer quadrant of the RIGHT breast at posterior depth. Ultrasound: Targeted sonographic evaluation of the RIGHT breast was performed. 2.6 x 1.8 x 2.4 cm oval hypoechoic mass with indistinct margins is seen at 10:30 6 CMFN. It contains calcifications and correlates to the mass seen on mammogram. No enlarged RIGHT axillary lymph nodes are present. LEFT: Mammogram: No suspicious mass, distortion, or microcalcifications are identified to suggest presence of  malignancy. IMPRESSION: 1. Highly suspicious 2.6 cm RIGHT breast mass (10:30 6 CMFN), corresponding to the palpable abnormality and mammographic mass. 2. No evidence of LEFT breast malignancy. RECOMMENDATION: Ultrasound-guided biopsy of 2.6 cm RIGHT breast mass (10:30 6 CMFN). I have discussed the findings and recommendations with the patient. The biopsy procedure was explained to the patient and questions were answered. Patient expressed their understanding of the biopsy recommendation. Patient will be scheduled for biopsy at her earliest convenience by the schedulers. Ordering provider will be notified. If applicable, a reminder letter will be sent to the patient regarding the next appointment. BI-RADS CATEGORY  5: Highly suggestive of malignancy. Electronically Signed   By: Aliene Lloyd M.D.   On: 02/20/2024 12:21     PATHOLOGY:  R Breast Biopsy 02/22/24:  Accession #: [id number]  Patient Name: SERIN, THORNELL  Visit # : [id number]   MRN: [id number]  Physician: Vonzell Knee  DOB/Age 07/03/1955 (Age: 27) Gender: F  Collected Date: 02/22/2024  Received Date: 02/22/2024   FINAL DIAGNOSIS        1. Breast, right, needle core biopsy, 10:30 6cmfn :       INVASIVE DUCTAL CARCINOMA       DUCTAL CARCINOMA IN SITU, SOLID TYPE, INTERMEDIATE NUCLEAR GRADE       TUBULE FORMATION: SCORE 3       NUCLEAR PLEOMORPHISM: SCORE 2       MITOTIC COUNT: SCORE 1       TOTAL SCORE: 6       OVERALL GRADE: 2       LYMPHOVASCULAR INVASION: NOT IDENTIFIED       CANCER LENGTH: 8 MM / 0.8 CM       CALCIFICATIONS: NOT IDENTIFIED       OTHER FINDINGS: NONE     IMMUNOHISTOCHEMICAL AND MORPHOMETRIC ANALYSIS PERFORMED MANUALLY  The tumor cells are NEGATIVE for Her2 (1+).  Estrogen Receptor:  100%, POSITIVE, STRONG STAINING INTENSITY  Progesterone Receptor:  2%, POSITIVE, MODERATE STAINING INTENSITY  Proliferation Marker Ki67: 30%    IMPRESSION/PLAN:   Patient with an anatomic Stage IIA/prognostic Stage IB  [id number] IDC of the R breast, Grade 2, ER/PR+, Her2- with a Ki67 of 30% who is seen pre-operatively for consideration of adjuvant radiation therapy. We discussed the role of breast irradiation in reducing the risk of local recurrence and improving long-term disease control.  We reviewed the logistics of treatment in detail, including the need for a CT simulation for treatment planning, followed by several days for contouring, dosimetry, and quality assurance prior to starting therapy. Anticipated course of treatment will involve daily sessions, Monday through Friday, over approximately 1-3 weeks. Choice regarding moderately hypofractionated RT vs ultrahypofractionated RT will be made after surgery.  Each session will last only a few minutes although set up time is longer such that she should expect to be in the department around 45 minutes.  She will see me once  a week to ensure she is safe to continue radiation and that we can manage any side effects she may be experiencing.  We discussed acute and subacute side effects which are generally gradual in onset and typically include fatigue, skin erythema, tanning or hyperpigmentation, breast edema or firmness, mild tenderness.  Less common but possible side effects include desquamation of the skin, decreased range of motion of this shoulder, and transient changes in breast size texture.  Long-term risk such as cosmetic changes, rare risk of rib fracture were also reviewed.  Reassured patient that most acute side effects improve over weeks to months after completing therapy.    Patient verbalized understanding of these risks and benefits and she is in agreement with the plan. I will see her in follow-up after surgery. Final treatment planning will depend on her postoperative pathology report, but we anticipate adjuvant whole breast radiation. Do not anticipate the need to radiate her axilla or any nodal regions.   --  Total time spent today in preparation for  this visit was 60 minutes. This included patient care, imaging and path review, documentation, multidisciplinary discussion and coordination of care and follow up.    Estefana HERO. Maritza, M.D.

## 2024-03-12 ENCOUNTER — Inpatient Hospital Stay

## 2024-03-12 ENCOUNTER — Inpatient Hospital Stay: Attending: Hematology and Oncology | Admitting: Hematology and Oncology

## 2024-03-12 ENCOUNTER — Encounter: Payer: Self-pay | Admitting: *Deleted

## 2024-03-12 VITALS — BP 145/95 | HR 78 | Temp 98.5°F | Resp 18 | Ht 59.25 in | Wt 160.4 lb

## 2024-03-12 DIAGNOSIS — Z170 Estrogen receptor positive status [ER+]: Secondary | ICD-10-CM

## 2024-03-12 DIAGNOSIS — Z85828 Personal history of other malignant neoplasm of skin: Secondary | ICD-10-CM | POA: Insufficient documentation

## 2024-03-12 DIAGNOSIS — C50411 Malignant neoplasm of upper-outer quadrant of right female breast: Secondary | ICD-10-CM | POA: Diagnosis not present

## 2024-03-12 DIAGNOSIS — Z79899 Other long term (current) drug therapy: Secondary | ICD-10-CM | POA: Insufficient documentation

## 2024-03-12 DIAGNOSIS — Z1732 Human epidermal growth factor receptor 2 negative status: Secondary | ICD-10-CM | POA: Insufficient documentation

## 2024-03-12 DIAGNOSIS — Z1721 Progesterone receptor positive status: Secondary | ICD-10-CM | POA: Insufficient documentation

## 2024-03-12 NOTE — Progress Notes (Signed)
 [facility] CONSULT NOTE  Patient Care Team: Teresa Channel, MD as PCP - General (Family Medicine) Gerome, Devere HERO, RN as Registered Nurse Tyree Nanetta SAILOR, RN as Oncology Nurse Navigator Odean Potts, MD as Consulting Physician (Hematology and Oncology) Maritza Stagger, MD as Consulting Physician (Radiation Oncology) Aron Shoulders, MD as Consulting Physician (General Surgery)  CHIEF COMPLAINTS/PURPOSE OF CONSULTATION:  Newly diagnosed breast cancer  HISTORY OF PRESENTING ILLNESS:    History of Present Illness Melanie Reese is a 68 year old female with invasive ductal carcinoma of the breast who presents for oncology consultation.  She discovered a lump in her breast without associated pain. Imaging and biopsy confirmed invasive ductal carcinoma, measuring approximately 2.6 cm. The cancer is estrogen receptor-positive (100%), weakly progesterone receptor-positive (2%), and HER2-negative, with a KI-67 proliferation index of 30%.  She is scheduled for a lumpectomy and sentinel lymph node surgery on November 11th, with plans to remove 1 to 4 lymph nodes. An oncotype DX test will be conducted post-surgery to assess the need for chemotherapy.  Current medications include phenobarbital, taken since 1975, and she is supposed to take B12 and vitamin D. She has a history of basal cell carcinoma on her face, treated with topical fluorouracil.  She is avoiding sugary products and considering dietary changes to reduce simple sugars and increase complex carbohydrates. No lymph node enlargement noted on imaging.     I reviewed her records extensively and collaborated the history with the patient.  SUMMARY OF ONCOLOGIC HISTORY: Oncology History  Malignant neoplasm of upper-outer quadrant of right breast in female, estrogen receptor positive (HCC)  02/22/2024 Initial Diagnosis   Patient had a palpable lump in the right breast measuring 2.6 cm by ultrasound biopsy revealed grade 2  IDC ER 100% PR 2% Ki67 30%, 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, axilla negative      MEDICAL HISTORY:  No past medical history on file.  SURGICAL HISTORY: Past Surgical History:  Procedure Laterality Date   BREAST BIOPSY Right 02/22/2024   US  RT BREAST BX W LOC DEV 1ST LESION IMG BX SPEC US  GUIDE 02/22/2024 [facility] MAMMOGRAPHY    SOCIAL HISTORY: Social History   Socioeconomic History   Marital status: Single    Spouse name: Not on file   Number of children: Not on file   Years of education: Not on file   Highest education level: Not on file  Occupational History   Not on file  Tobacco Use   Smoking status: Not on file   Smokeless tobacco: Not on file  Substance and Sexual Activity   Alcohol use: Not on file   Drug use: Not on file   Sexual activity: Not on file  Other Topics Concern   Not on file  Social History Narrative   Not on file   Social Drivers of Health   Financial Resource Strain: Not on file  Food Insecurity: No Food Insecurity (03/12/2024)   Hunger Vital Sign    Worried About Running Out of Food in the Last Year: Never true    Ran Out of Food in the Last Year: Never true  Transportation Needs: No Transportation Needs (03/12/2024)   PRAPARE - Administrator, Civil Service (Medical): No    Lack of Transportation (Non-Medical): No  Physical Activity: Not on file  Stress: Not on file  Social Connections: Unknown (09/23/2021)   Received from [facility]   Social Network    Social Network: Not on file  Intimate Partner  Violence: Not At Risk (03/12/2024)   Humiliation, Afraid, Rape, and Kick questionnaire    Fear of Current or Ex-Partner: No    Emotionally Abused: No    Physically Abused: No    Sexually Abused: No    FAMILY HISTORY: Family History  Problem Relation Age of Onset   Breast cancer Neg Hx     ALLERGIES:  is allergic to doxycycline and sulfa antibiotics.  MEDICATIONS:  Current Outpatient Medications  Medication Sig Dispense Refill    fluticasone (FLONASE) 50 MCG/ACT nasal spray 1 spray.     triamterene-hydrochlorothiazide (MAXZIDE-25) 37.5-25 MG tablet TAKE 1 TABLET BY MOUTH EVERY MORNING AS NEEDED FOR SWELLING 30     acetaminophen (TYLENOL) 500 MG tablet 500 mg. Prn     Cholecalciferol 125 MCG (5000 UT) TABS Take 5,000 Units by mouth.     cyanocobalamin 2000 MCG tablet Take 2,000 mcg by mouth.     PHENobarbital (LUMINAL) 97.2 MG tablet Take 97.2 mg by mouth at bedtime.     No current facility-administered medications for this visit.    REVIEW OF SYSTEMS:   Constitutional: Denies fevers, chills or abnormal night sweats Breast: Palpable lump in the right breast All other systems were reviewed with the patient and are negative.  PHYSICAL EXAMINATION: ECOG PERFORMANCE STATUS: 1 - Symptomatic but completely ambulatory  Vitals:   03/12/24 1458 03/12/24 1500  BP: (!) 148/98 (!) 145/95  Pulse: 78 78  Resp: 18 18  Temp: 98.5 F (36.9 C) 98.5 F (36.9 C)  SpO2: 98% 98%   Filed Weights   03/12/24 1458 03/12/24 1500  Weight: 160 lb 6.4 oz (72.8 kg) 160 lb 6.4 oz (72.8 kg)    GENERAL:alert, no distress and comfortable      RADIOGRAPHIC STUDIES: I have personally reviewed the radiological reports and agreed with the findings in the report.  ASSESSMENT AND PLAN:  Malignant neoplasm of upper-outer quadrant of right breast in female, estrogen receptor positive (HCC) 02/22/2024:Patient had a palpable lump in the right breast measuring 2.6 cm by ultrasound biopsy revealed 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%, 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, axilla negative  Pathology and radiology counseling:Discussed with the patient, the details of pathology including the type of breast cancer,the clinical staging, the significance of ER, PR and HER-2/neu receptors and the implications for treatment. After reviewing the pathology in detail, we proceeded to discuss the different treatment options between surgery, radiation, chemotherapy, antiestrogen  therapies.  Recommendations: 1. Breast conserving surgery followed by 2. Oncotype DX testing to determine if chemotherapy would be of any benefit followed by 3. Adjuvant radiation therapy followed by 4. Adjuvant antiestrogen therapy  Oncotype counseling: I discussed Oncotype DX test. I explained to the patient that this is a 21 gene panel to evaluate patient tumors DNA to calculate recurrence score. This would help determine whether patient has high risk or low risk breast cancer. She understands that if her tumor was found to be high risk, she would benefit from systemic chemotherapy. If low risk, no need of chemotherapy.  Return to clinic after surgery to discuss final pathology report and then determine if Oncotype DX testing will need to be sent.       All questions were answered. The patient knows to call the clinic with any problems, questions or concerns. I personally spent a total of 30 minutes in the care of the patient today including preparing to see the patient, getting/reviewing separately obtained history, performing a medically appropriate exam/evaluation, counseling  and educating, placing orders, referring and communicating with other health care professionals, documenting clinical information in the EHR, independently interpreting results, communicating results, and coordinating care.   Viinay K Foster Sonnier, MD 03/12/24

## 2024-03-12 NOTE — Assessment & Plan Note (Signed)
 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 After reviewing the pathology in detail, we proceeded to discuss the different treatment options between surgery, radiation, chemotherapy, antiestrogen therapies.  Recommendations: 1. Breast conserving surgery followed by 2. Oncotype DX testing to determine if chemotherapy would be of any benefit followed by 3. Adjuvant radiation therapy followed by 4. Adjuvant antiestrogen therapy  Oncotype counseling: I discussed Oncotype DX test. I explained to the patient that this is a 21 gene panel to evaluate patient tumors DNA to calculate recurrence score. This would help determine whether patient has high risk or low risk breast cancer. She understands that if her tumor was found to be high risk, she would benefit from systemic chemotherapy. If low risk, no need of chemotherapy.  Return to clinic after surgery to discuss final pathology report and then determine if Oncotype DX testing will need to be sent.

## 2024-03-12 NOTE — Research (Signed)
 [employer] 2021-05 - Specimen Collection Study to Evaluate Biomarkers in Subjects with Cancer     Patient Melanie Reese was identified by Dr.Gudena as a potential candidate for the above listed study.  This Clinical Research Nurse met with Melanie Reese, [id number], on 03/12/24 in a manner and location that ensures patient privacy to discuss participation in the above listed research study.  Patient is Accompanied by husband.  A copy of the informed consent document with embedded HIPAA language was provided to the patient.  Patient reads, speaks, and understands English.   Patient was provided with the business card of this Nurse and encouraged to contact the research team with any questions.  Approximately 10 minutes were spent with the patient reviewing the informed consent documents.  Patient was provided the option of taking informed consent documents home to review and was encouraged to review at their convenience with their support network, including other care providers. Patient took the consent documents home to review.  The pt was informed that she will need to consent and have her blood drawn before her surgery or before any treatment. Pt stated she has her surgery on 03/25/24. The pt was told that her participation is optional for this study and that she would get a $50 gift card if she participates in the study with a successful blood sample. Her husband Melanie Reese was also interested in enrolling in the study as well.  Also informed her that we can try to do her consent and blood draw at the same time she does her pre admission for surgery. She stated we can call her next week any time to follow up on interest in the study after she has had time to read the consent. Pt did not have any more questions at this time. She and her Husband was thanked for their time and consideration.   Melanie Larsen, RN, BSN Clinical Research Nurse [phone] 03/12/2024

## 2024-03-12 NOTE — Progress Notes (Signed)
 Location of Breast Cancer:Right Breast  Histology per Pathology Report: ***  Receptor Status: ER(***), PR (***), Her2-neu (***), Ki-67(***)  Did patient present with symptoms (if so, please note symptoms) or was this found on screening mammography?: Mammogram on 02/20/24.  Past/Anticipated interventions by surgeon, if any:{t:21944} *** BREAST LUMPECTOMY scheduled for 03/25/24 Right    Past/Anticipated interventions by medical oncology, if any: Chemotherapy ***  Lymphedema issues, if any:  {:18581} {t:21944}   Pain issues, if any:  {:18581} {PAIN DESCRIPTION:21022940}  SAFETY ISSUES: Prior radiation? {:18581} Pacemaker/ICD? {:18581} Possible current pregnancy?{:18581} Is the patient on methotrexate? {:18581}  Current Complaints / other details:  ***

## 2024-03-13 ENCOUNTER — Ambulatory Visit
Admission: RE | Admit: 2024-03-13 | Discharge: 2024-03-13 | Disposition: A | Discharge status: Home / Self Care | Source: Ambulatory Visit | Attending: Radiation Oncology | Admitting: Radiation Oncology

## 2024-03-13 ENCOUNTER — Encounter: Payer: Self-pay | Admitting: Radiation Oncology

## 2024-03-13 ENCOUNTER — Telehealth: Payer: Self-pay | Admitting: Hematology and Oncology

## 2024-03-13 VITALS — BP 138/82 | HR 84 | Temp 99.0°F | Resp 18 | Wt 156.4 lb

## 2024-03-13 DIAGNOSIS — C50411 Malignant neoplasm of upper-outer quadrant of right female breast: Secondary | ICD-10-CM

## 2024-03-13 DIAGNOSIS — Z8041 Family history of malignant neoplasm of ovary: Secondary | ICD-10-CM | POA: Diagnosis not present

## 2024-03-13 DIAGNOSIS — Z1721 Progesterone receptor positive status: Secondary | ICD-10-CM | POA: Insufficient documentation

## 2024-03-13 DIAGNOSIS — Z1732 Human epidermal growth factor receptor 2 negative status: Secondary | ICD-10-CM | POA: Insufficient documentation

## 2024-03-13 DIAGNOSIS — Z79899 Other long term (current) drug therapy: Secondary | ICD-10-CM | POA: Insufficient documentation

## 2024-03-13 DIAGNOSIS — Z170 Estrogen receptor positive status [ER+]: Secondary | ICD-10-CM | POA: Insufficient documentation

## 2024-03-13 DIAGNOSIS — Z923 Personal history of irradiation: Secondary | ICD-10-CM | POA: Insufficient documentation

## 2024-03-13 NOTE — Telephone Encounter (Signed)
 left vm for patient about scheduled post op appt dater and time. Encouraged pt to call back if need to reschedule

## 2024-03-17 ENCOUNTER — Telehealth: Payer: Self-pay | Admitting: Licensed Clinical Social Worker

## 2024-03-17 ENCOUNTER — Telehealth: Payer: Self-pay | Admitting: *Deleted

## 2024-03-17 ENCOUNTER — Encounter (HOSPITAL_BASED_OUTPATIENT_CLINIC_OR_DEPARTMENT_OTHER): Payer: Self-pay | Admitting: General Surgery

## 2024-03-17 NOTE — Telephone Encounter (Signed)
 [employer] 2021-05 - Specimen Collection Study to Evaluate Biomarkers in Subjects with Cancer    Spoke with pt today regarding her interest in the above study. Pt stated she's still interested in it and would like to wait to see when her surgical pre op appt will be before scheduling her consent and blood draw. Informed pt that I can follow up with her tomorrow after she gets it scheduled. Pt was in agreement and did not have any more questions at this time.   Mazie Larsen, RN, BSN Clinical Research Nurse [phone] 03/17/2024

## 2024-03-17 NOTE — Telephone Encounter (Signed)
 [facility] Clinical Social Work  Clinical Social Work was referred by new patient protocol for assessment of psychosocial needs.  Clinical Social Worker attempted to contact patient by phone to offer support and assess for needs.   No answer. Left VM with direct contact information and brief description of support services.     Jerimy Johanson E Carlis Burnsworth, LCSW  Clinical Social Worker [facility]

## 2024-03-18 ENCOUNTER — Telehealth: Payer: Self-pay | Admitting: *Deleted

## 2024-03-18 DIAGNOSIS — Z1151 Encounter for screening for human papillomavirus (HPV): Secondary | ICD-10-CM | POA: Diagnosis not present

## 2024-03-18 DIAGNOSIS — Z124 Encounter for screening for malignant neoplasm of cervix: Secondary | ICD-10-CM | POA: Diagnosis not present

## 2024-03-18 DIAGNOSIS — Z9189 Other specified personal risk factors, not elsewhere classified: Secondary | ICD-10-CM | POA: Diagnosis not present

## 2024-03-18 DIAGNOSIS — M8588 Other specified disorders of bone density and structure, other site: Secondary | ICD-10-CM | POA: Diagnosis not present

## 2024-03-18 DIAGNOSIS — C50919 Malignant neoplasm of unspecified site of unspecified female breast: Secondary | ICD-10-CM | POA: Diagnosis not present

## 2024-03-18 DIAGNOSIS — Z01419 Encounter for gynecological examination (general) (routine) without abnormal findings: Secondary | ICD-10-CM | POA: Diagnosis not present

## 2024-03-18 DIAGNOSIS — N952 Postmenopausal atrophic vaginitis: Secondary | ICD-10-CM | POA: Diagnosis not present

## 2024-03-18 NOTE — Telephone Encounter (Signed)
 [employer] 2021-05 - Specimen Collection Study to Evaluate Biomarkers in Subjects with Cancer    Attempted to follow up with pt to scheduled [employer] consent and blood draw. LVM for return call.   Mazie Larsen, RN, BSN Clinical Research Nurse [phone] 03/18/2024

## 2024-03-19 ENCOUNTER — Telehealth: Payer: Self-pay | Admitting: *Deleted

## 2024-03-19 NOTE — Telephone Encounter (Signed)
 [employer] 2021-05 - Specimen Collection Study to Evaluate Biomarkers in Subjects with Cancer    Attempted to follow up with pt this afternoon regarding interest in above clinical trial. LVM for return call.   Mazie Larsen, RN, BSN Clinical Research Nurse [phone] 03/19/2024

## 2024-03-20 ENCOUNTER — Inpatient Hospital Stay

## 2024-03-20 ENCOUNTER — Telehealth: Payer: Self-pay | Admitting: *Deleted

## 2024-03-20 ENCOUNTER — Encounter (HOSPITAL_BASED_OUTPATIENT_CLINIC_OR_DEPARTMENT_OTHER)
Admission: RE | Admit: 2024-03-20 | Discharge: 2024-03-20 | Disposition: A | Discharge status: Home / Self Care | Source: Ambulatory Visit | Attending: General Surgery | Admitting: General Surgery

## 2024-03-20 DIAGNOSIS — Z01812 Encounter for preprocedural laboratory examination: Secondary | ICD-10-CM | POA: Insufficient documentation

## 2024-03-20 LAB — BASIC METABOLIC PANEL WITH GFR
Anion gap: 11 (ref 5–15)
BUN: 13 mg/dL (ref 8–23)
CO2: 24 mmol/L (ref 22–32)
Calcium: 9 mg/dL (ref 8.9–10.3)
Chloride: 104 mmol/L (ref 98–111)
Creatinine, Ser: 0.94 mg/dL (ref 0.44–1.00)
GFR, Estimated: >60 mL/min (ref >60)
Glucose, Bld: 86 mg/dL (ref 70–99)
Potassium: 4.1 mmol/L (ref 3.5–5.1)
Sodium: 139 mmol/L (ref 135–145)

## 2024-03-20 MED ORDER — CHLORHEXIDINE GLUCONATE CLOTH 2 % EX PADS: 6.0000 | MEDICATED_PAD | Freq: Once | CUTANEOUS | Status: DC

## 2024-03-20 NOTE — Progress Notes (Signed)

## 2024-03-20 NOTE — Telephone Encounter (Signed)
 [employer] 2021-05 - Specimen Collection Study to Evaluate Biomarkers in Subjects with Cancer    Spoke with pt and husband this afternoon regarding above study. They stated they are interested in participating in the study. An appointment has been made for 03/24/24 @ 1pm for consent and blood draw.   Mazie Larsen, RN, BSN Clinical Research Nurse [phone] 03/20/2024

## 2024-03-24 ENCOUNTER — Other Ambulatory Visit: Payer: Self-pay | Admitting: *Deleted

## 2024-03-24 ENCOUNTER — Inpatient Hospital Stay

## 2024-03-24 ENCOUNTER — Encounter: Payer: Self-pay | Admitting: *Deleted

## 2024-03-24 ENCOUNTER — Inpatient Hospital Stay: Attending: Hematology and Oncology

## 2024-03-24 DIAGNOSIS — Z170 Estrogen receptor positive status [ER+]: Secondary | ICD-10-CM

## 2024-03-24 LAB — RESEARCH LABS

## 2024-03-24 NOTE — Research (Signed)
 [employer] 2021-05 - Specimen Collection Study to Evaluate Biomarkers in Subjects with Cancer    This Coordinator has reviewed this patient's inclusion and exclusion criteria as a second review and confirms patient is eligible for study participation. Patient may continue with enrollment.  Laury Quale, MPH  Clinical Research Coordinator

## 2024-03-24 NOTE — H&P (View-Only) (Signed)
 PROVIDER: JINA CLAIR NEPHEW, MD Patient Care Team: Teresa Channel, MD as PCP - General (Family Medicine) Gerome Devere HERO, RN as Registered Nurse Tyree Nanetta SAILOR, RN as Oncology Nurse Navigator Odean Potts, MD as Consulting Physician (Hematology and Oncology) Maritza Stagger, MD as Consulting Physician (Radiation Oncology) Nephew Jina, MD as Consulting Physician (Surgical Oncology)   MRN: [id number] DOB: 04-08-1956 DATE OF ENCOUNTER: 02/29/2024  Subjective   Chief Complaint: New Breast Cancer (Rt breast cancer)   History of Present Illness: Melanie Reese is a 68 y.o. female who is seen today as an office consultation at the request of Dr. Teresa for evaluation of New Breast Cancer (Rt breast cancer) .   Patient presents with new diagnosis of right breast cancer 02/2024. She had a palpable breast mass. She hadn't had mammography for several years before this. Diagnostic imaging was performed. This showed a 2.6 cm mass at 10:30 on the right with a negative axilla. 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   History of Present Illness Melanie Reese is a 68 year old female who presents with a breast mass found during a routine mammogram. She was referred for further evaluation after a biopsy confirmed cancer in the breast mass.  She initially discovered a mass in her breast, mistaking it for her rib or bone. A routine mammogram, which she had not undergone in a couple of years, led to further evaluation. The mammogram and subsequent biopsy confirmed the presence of cancer in the breast mass.  Her mother had a pre-cancerous lesion removed from the breast and her maternal aunt had ovarian cancer. Her father had polyps, which may be relevant to her genetic evaluation.  She has a history of arthritis and was considering knee surgery but opted to delay it. She also has a history of a bleeding stomach ulcer and a past head  injury that resulted in a blood clot requiring surgery.  She is currently managing scaly skin lesions on her nose with a topical cream prescribed by her dermatologist. She has had multiple surgeries on her nose for similar issues in the past.  She is concerned about her diet and its impact on her cancer, specifically asking about sugar intake and processed foods.  She is retired and lives with two elderly mothers, aged 36 and 57, whom she helps care for.  Family cancer history - negative  Work -patient is retired, but her mother and her mother-in-law both live with her and her husband and she does a lot of physical help with them.   Diagnostic mammogram/us  :02/20/24 BCG ACR Breast Density Category b: There are scattered areas of fibroglandular density. FINDINGS: RIGHT: Mammogram: Oval mass with indistinct and spiculated margins and pleomorphic calcifications seen in the upper-outer quadrant of the RIGHT breast at posterior depth. Ultrasound: Targeted sonographic evaluation of the RIGHT breast was performed. 2.6 x 1.8 x 2.4 cm oval hypoechoic mass with indistinct margins is seen at 10:30 6 CMFN. It contains calcifications and correlates to the mass seen on mammogram. No enlarged RIGHT axillary lymph nodes are present. LEFT: Mammogram: No suspicious mass, distortion, or microcalcifications are identified to suggest presence of malignancy. IMPRESSION: 1. Highly suspicious 2.6 cm RIGHT breast mass (10:30 6 CMFN), corresponding to the palpable abnormality and mammographic mass. 2. No evidence of LEFT breast malignancy. RECOMMENDATION: Ultrasound-guided biopsy of 2.6 cm RIGHT breast mass (10:30 6 CMFN). I have discussed the findings and recommendations with the patient. The  biopsy procedure was explained to the patient and questions were answered. Patient expressed their understanding of the biopsy recommendation. Patient will be scheduled for biopsy at her earliest convenience  by the schedulers. Ordering provider will be notified. If applicable, a reminder letter will be sent to the patient regarding the next appointment. BI-RADS CATEGORY 5: Highly suggestive of malignancy.   Pathology core needle biopsy: 02/22/24 1. Breast, right, needle core biopsy, 10:30 6cmfn :  INVASIVE DUCTAL CARCINOMA  DUCTAL CARCINOMA IN SITU, SOLID TYPE, INTERMEDIATE NUCLEAR GRADE  TUBULE FORMATION: SCORE 3  NUCLEAR PLEOMORPHISM: SCORE 2  MITOTIC COUNT: SCORE 1  TOTAL SCORE: 6  OVERALL GRADE: 2  LYMPHOVASCULAR INVASION: NOT IDENTIFIED  CANCER LENGTH: 8 MM / 0.8 CM  CALCIFICATIONS: NOT IDENTIFIED  OTHER FINDINGS: NONE  SEE NOTE   Receptors: The tumor cells are NEGATIVE for Her2 (1+).  Estrogen Receptor: 100%, POSITIVE, STRONG STAINING INTENSITY  Progesterone Receptor: 2%, POSITIVE, MODERATE STAINING INTENSITY  Proliferation Marker Ki67: 30%   Review of Systems: A complete review of systems was obtained from the patient. I have reviewed this information and discussed as appropriate with the patient. See HPI as well for other ROS. ROS -positive for itching and leg swelling.  Medical History: Past Medical History:  Diagnosis Date  Anemia  GERD (gastroesophageal reflux disease)  History of cancer  Seizures (CMS/HHS-HCC)   Patient Active Problem List  Diagnosis  Malignant neoplasm of upper-outer quadrant of right breast in female, estrogen receptor positive (CMS/HHS-HCC)  Family history of cancer   Past Surgical History:  Procedure Laterality Date  INCISIONAL BIOPSY BREAST Right 02/22/2024    Allergies  Allergen Reactions  Sulfa (Sulfonamide Antibiotics) Other (See Comments) and Rash  Doxycycline Swelling  Facial swelling Facial swelling  Facial swelling   Current Outpatient Medications on File Prior to Visit  Medication Sig Dispense Refill  cholecalciferol, vitamin D3, (VITAMIN D3) 125 mcg (5,000 unit) tablet Take 5,000 Units by mouth once daily   cyanocobalamin, vitamin B-12, 2,000 mcg Tab Take 2,000 mcg by mouth once daily  fluticasone propionate (FLONASE) 50 mcg/actuation nasal spray 1 SPRAY IN EACH NOSTRIL NASALLY ONCE A DAY 30 DAYS  PHENobarbitaL (LUMINAL) 97.2 MG tablet 1 TABLET BY MOUTH ONCE A DAY 30 DAYS  triamterene-hydroCHLOROthiazide (MAXZIDE-25) 37.5-25 mg tablet TAKE 1 TABLET BY MOUTH EVERY MORNING AS NEEDED FOR SWELLING 30  fluorouraciL (EFUDEX) 5 % cream APPLY 1 A SMALL AMOUNT TO AFFECTED AREA TWICE A DAY TO SCALY AREAS ON NOSE X 2 WEEKS (Patient not taking: Reported on 02/29/2024)  tretinoin (RETIN-A) 0.025 % cream Apply topically once daily Apply to affected area (Patient not taking: Reported on 02/29/2024)   No current facility-administered medications on file prior to visit.   Family History  Problem Relation Age of Onset  Skin cancer Mother  High blood pressure (Hypertension) Mother  Breast cancer Mother  Skin cancer Father  High blood pressure (Hypertension) Father  Hyperlipidemia (Elevated cholesterol) Father    Social History   Tobacco Use  Smoking Status Former  Types: Cigarettes  Smokeless Tobacco Never    Social History   Socioeconomic History  Marital status: Single  Tobacco Use  Smoking status: Former  Types: Cigarettes  Smokeless tobacco: Never  Substance and Sexual Activity  Alcohol use: Yes  Alcohol/week: 0.0 - 1.0 standard drinks of alcohol  Drug use: Never  Sexual activity: Defer   Social Drivers of Health   Received from [facility]  Social Network  Housing Stability: Unknown (02/29/2024)  Housing Stability  Vital Sign  Homeless in the Last Year: No   Objective:   Vitals:  02/29/24 1151  BP: (!) 154/90  Pulse: 79  Temp: 36.6 C (97.9 F)  TempSrc: Temporal  SpO2: 99%  Weight: 71.3 kg (157 lb 3.2 oz)  Height: 149.9 cm (4' 11)  PainSc: 1  PainLoc: Breast   Body mass index is 31.75 kg/m.  Gen: No acute distress. Well nourished and well groomed.  Neurological:  Alert and oriented to person, place, and time. Coordination normal.  Head: Normocephalic and atraumatic.  Eyes: Conjunctivae are normal. Pupils are equal, round, and reactive to light. No scleral icterus.  Neck: Normal range of motion. Neck supple. No tracheal deviation or thyromegaly present.  Cardiovascular: Normal rate, regular rhythm, normal heart sounds and intact distal pulses. Exam reveals no gallop and no friction rub. No murmur heard. Breast: Breast slightly larger than the left, but there is a significant size hematoma present. Palpable mass at 10:00 on the right. This is approximately 2 cm from the areola. It is mobile and does not involve the skin. There is no nipple retraction or nipple discharge. No lymphadenopathy palpable. Left side is benign. Respiratory: Effort normal. No respiratory distress. No chest wall tenderness. Breath sounds normal. No wheezes, rales or rhonchi.  GI: Soft. Bowel sounds are normal. The abdomen is soft and nontender. There is no rebound and no guarding.  Musculoskeletal: Normal range of motion. Extremities are nontender.  Lymphadenopathy: No cervical, preauricular, postauricular or axillary adenopathy is present Skin: Skin is warm and dry. No rash noted. No diaphoresis. No erythema. No pallor. No clubbing, cyanosis, or edema.  Psychiatric: Normal mood and affect. Behavior is normal. Judgment and thought content normal.   Labs No new labs available  Assessment and Plan:   ICD-10-CM  1. Malignant neoplasm of upper-outer quadrant of right breast in female, estrogen receptor positive (CMS/HHS-HCC) C50.411 Ambulatory Referral to Oncology-Medical  Z17.0 Ambulatory Referral to Radiation Oncology  Ambulatory Referral to Physical Therapy  Ambulatory Referral to Cancer Genetics   2. Family history of cancer Z80.9 Ambulatory Referral to Cancer Genetics    Patient presents with cT2N0 right breast cancer. This is amenable to breast conserving surgery. This would  be followed by oncotype, radiation, and antihormonal tx. I will refer to medical and radiation oncology as well as genetics.   The surgical procedure was described to the patient. I discussed the incision type and location. I do not think that we need to seed since the lesion is palpable.  We discussed the risks bleeding, infection, damage to other structures, need for further procedures/surgeries. We discussed the risk of seroma. The patient was advised if the breast has cancer, we may need to go back to surgery for additional tissue to obtain negative margins or for a lymph node biopsy. The patient was advised that these are the most common complications, but that others can occur as well. I discussed the risk of alteration in breast contour or size. I discussed risk of chronic pain. There are rare instances of heart/lung issues post op as well as blood clots.   They were advised against taking aspirin or other anti-inflammatory agents/blood thinners the week before surgery.   The risks and benefits of the procedure were described to the patient and she wishes to proceed.   I will also refer her to genetics for genetic testing.

## 2024-03-24 NOTE — H&P (Signed)
 PROVIDER: JINA CLAIR NEPHEW, MD Patient Care Team: Teresa Channel, MD as PCP - General (Family Medicine) Gerome Devere HERO, RN as Registered Nurse Tyree Nanetta SAILOR, RN as Oncology Nurse Navigator Odean Potts, MD as Consulting Physician (Hematology and Oncology) Maritza Stagger, MD as Consulting Physician (Radiation Oncology) Nephew Jina, MD as Consulting Physician (Surgical Oncology)   MRN: [id number] DOB: 04-08-1956 DATE OF ENCOUNTER: 02/29/2024  Subjective   Chief Complaint: New Breast Cancer (Rt breast cancer)   History of Present Illness: Melanie Reese is a 68 y.o. female who is seen today as an office consultation at the request of Dr. Teresa for evaluation of New Breast Cancer (Rt breast cancer) .   Patient presents with new diagnosis of right breast cancer 02/2024. She had a palpable breast mass. She hadn't had mammography for several years before this. Diagnostic imaging was performed. This showed a 2.6 cm mass at 10:30 on the right with a negative axilla. 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   History of Present Illness Melanie Reese is a 68 year old female who presents with a breast mass found during a routine mammogram. She was referred for further evaluation after a biopsy confirmed cancer in the breast mass.  She initially discovered a mass in her breast, mistaking it for her rib or bone. A routine mammogram, which she had not undergone in a couple of years, led to further evaluation. The mammogram and subsequent biopsy confirmed the presence of cancer in the breast mass.  Her mother had a pre-cancerous lesion removed from the breast and her maternal aunt had ovarian cancer. Her father had polyps, which may be relevant to her genetic evaluation.  She has a history of arthritis and was considering knee surgery but opted to delay it. She also has a history of a bleeding stomach ulcer and a past head  injury that resulted in a blood clot requiring surgery.  She is currently managing scaly skin lesions on her nose with a topical cream prescribed by her dermatologist. She has had multiple surgeries on her nose for similar issues in the past.  She is concerned about her diet and its impact on her cancer, specifically asking about sugar intake and processed foods.  She is retired and lives with two elderly mothers, aged 36 and 57, whom she helps care for.  Family cancer history - negative  Work -patient is retired, but her mother and her mother-in-law both live with her and her husband and she does a lot of physical help with them.   Diagnostic mammogram/us  :02/20/24 [facility] ACR Breast Density Category b: There are scattered areas of fibroglandular density. FINDINGS: RIGHT: Mammogram: Oval mass with indistinct and spiculated margins and pleomorphic calcifications seen in the upper-outer quadrant of the RIGHT breast at posterior depth. Ultrasound: Targeted sonographic evaluation of the RIGHT breast was performed. 2.6 x 1.8 x 2.4 cm oval hypoechoic mass with indistinct margins is seen at 10:30 6 CMFN. It contains calcifications and correlates to the mass seen on mammogram. No enlarged RIGHT axillary lymph nodes are present. LEFT: Mammogram: No suspicious mass, distortion, or microcalcifications are identified to suggest presence of malignancy. IMPRESSION: 1. Highly suspicious 2.6 cm RIGHT breast mass (10:30 6 CMFN), corresponding to the palpable abnormality and mammographic mass. 2. No evidence of LEFT breast malignancy. RECOMMENDATION: Ultrasound-guided biopsy of 2.6 cm RIGHT breast mass (10:30 6 CMFN). I have discussed the findings and recommendations with the patient. The  biopsy procedure was explained to the patient and questions were answered. Patient expressed their understanding of the biopsy recommendation. Patient will be scheduled for biopsy at her earliest convenience  by the schedulers. Ordering provider will be notified. If applicable, a reminder letter will be sent to the patient regarding the next appointment. BI-RADS CATEGORY 5: Highly suggestive of malignancy.   Pathology core needle biopsy: 02/22/24 1. Breast, right, needle core biopsy, 10:30 6cmfn :  INVASIVE DUCTAL CARCINOMA  DUCTAL CARCINOMA IN SITU, SOLID TYPE, INTERMEDIATE NUCLEAR GRADE  TUBULE FORMATION: SCORE 3  NUCLEAR PLEOMORPHISM: SCORE 2  MITOTIC COUNT: SCORE 1  TOTAL SCORE: 6  OVERALL GRADE: 2  LYMPHOVASCULAR INVASION: NOT IDENTIFIED  CANCER LENGTH: 8 MM / 0.8 CM  CALCIFICATIONS: NOT IDENTIFIED  OTHER FINDINGS: NONE  SEE NOTE   Receptors: The tumor cells are NEGATIVE for Her2 (1+).  Estrogen Receptor: 100%, POSITIVE, STRONG STAINING INTENSITY  Progesterone Receptor: 2%, POSITIVE, MODERATE STAINING INTENSITY  Proliferation Marker Ki67: 30%   Review of Systems: A complete review of systems was obtained from the patient. I have reviewed this information and discussed as appropriate with the patient. See HPI as well for other ROS. ROS -positive for itching and leg swelling.  Medical History: Past Medical History:  Diagnosis Date  Anemia  GERD (gastroesophageal reflux disease)  History of cancer  Seizures (CMS/HHS-HCC)   Patient Active Problem List  Diagnosis  Malignant neoplasm of upper-outer quadrant of right breast in female, estrogen receptor positive (CMS/HHS-HCC)  Family history of cancer   Past Surgical History:  Procedure Laterality Date  INCISIONAL BIOPSY BREAST Right 02/22/2024    Allergies  Allergen Reactions  Sulfa (Sulfonamide Antibiotics) Other (See Comments) and Rash  Doxycycline Swelling  Facial swelling Facial swelling  Facial swelling   Current Outpatient Medications on File Prior to Visit  Medication Sig Dispense Refill  cholecalciferol, vitamin D3, (VITAMIN D3) 125 mcg (5,000 unit) tablet Take 5,000 Units by mouth once daily   cyanocobalamin, vitamin B-12, 2,000 mcg Tab Take 2,000 mcg by mouth once daily  fluticasone propionate (FLONASE) 50 mcg/actuation nasal spray 1 SPRAY IN EACH NOSTRIL NASALLY ONCE A DAY 30 DAYS  PHENobarbitaL (LUMINAL) 97.2 MG tablet 1 TABLET BY MOUTH ONCE A DAY 30 DAYS  triamterene-hydroCHLOROthiazide (MAXZIDE-25) 37.5-25 mg tablet TAKE 1 TABLET BY MOUTH EVERY MORNING AS NEEDED FOR SWELLING 30  fluorouraciL (EFUDEX) 5 % cream APPLY 1 A SMALL AMOUNT TO AFFECTED AREA TWICE A DAY TO SCALY AREAS ON NOSE X 2 WEEKS (Patient not taking: Reported on 02/29/2024)  tretinoin (RETIN-A) 0.025 % cream Apply topically once daily Apply to affected area (Patient not taking: Reported on 02/29/2024)   No current facility-administered medications on file prior to visit.   Family History  Problem Relation Age of Onset  Skin cancer Mother  High blood pressure (Hypertension) Mother  Breast cancer Mother  Skin cancer Father  High blood pressure (Hypertension) Father  Hyperlipidemia (Elevated cholesterol) Father    Social History   Tobacco Use  Smoking Status Former  Types: Cigarettes  Smokeless Tobacco Never    Social History   Socioeconomic History  Marital status: Single  Tobacco Use  Smoking status: Former  Types: Cigarettes  Smokeless tobacco: Never  Substance and Sexual Activity  Alcohol use: Yes  Alcohol/week: 0.0 - 1.0 standard drinks of alcohol  Drug use: Never  Sexual activity: Defer   Social Drivers of Health   Received from [facility]  Social Network  Housing Stability: Unknown (02/29/2024)  Housing Stability  Vital Sign  Homeless in the Last Year: No   Objective:   Vitals:  02/29/24 1151  BP: (!) 154/90  Pulse: 79  Temp: 36.6 C (97.9 F)  TempSrc: Temporal  SpO2: 99%  Weight: 71.3 kg (157 lb 3.2 oz)  Height: 149.9 cm (4' 11)  PainSc: 1  PainLoc: Breast   Body mass index is 31.75 kg/m.  Gen: No acute distress. Well nourished and well groomed.  Neurological:  Alert and oriented to person, place, and time. Coordination normal.  Head: Normocephalic and atraumatic.  Eyes: Conjunctivae are normal. Pupils are equal, round, and reactive to light. No scleral icterus.  Neck: Normal range of motion. Neck supple. No tracheal deviation or thyromegaly present.  Cardiovascular: Normal rate, regular rhythm, normal heart sounds and intact distal pulses. Exam reveals no gallop and no friction rub. No murmur heard. Breast: Breast slightly larger than the left, but there is a significant size hematoma present. Palpable mass at 10:00 on the right. This is approximately 2 cm from the areola. It is mobile and does not involve the skin. There is no nipple retraction or nipple discharge. No lymphadenopathy palpable. Left side is benign. Respiratory: Effort normal. No respiratory distress. No chest wall tenderness. Breath sounds normal. No wheezes, rales or rhonchi.  GI: Soft. Bowel sounds are normal. The abdomen is soft and nontender. There is no rebound and no guarding.  Musculoskeletal: Normal range of motion. Extremities are nontender.  Lymphadenopathy: No cervical, preauricular, postauricular or axillary adenopathy is present Skin: Skin is warm and dry. No rash noted. No diaphoresis. No erythema. No pallor. No clubbing, cyanosis, or edema.  Psychiatric: Normal mood and affect. Behavior is normal. Judgment and thought content normal.   Labs No new labs available  Assessment and Plan:   ICD-10-CM  1. Malignant neoplasm of upper-outer quadrant of right breast in female, estrogen receptor positive (CMS/HHS-HCC) C50.411 Ambulatory Referral to Oncology-Medical  Z17.0 Ambulatory Referral to Radiation Oncology  Ambulatory Referral to Physical Therapy  Ambulatory Referral to Cancer Genetics   2. Family history of cancer Z80.9 Ambulatory Referral to Cancer Genetics    Patient presents with cT2N0 right breast cancer. This is amenable to breast conserving surgery. This would  be followed by oncotype, radiation, and antihormonal tx. I will refer to medical and radiation oncology as well as genetics.   The surgical procedure was described to the patient. I discussed the incision type and location. I do not think that we need to seed since the lesion is palpable.  We discussed the risks bleeding, infection, damage to other structures, need for further procedures/surgeries. We discussed the risk of seroma. The patient was advised if the breast has cancer, we may need to go back to surgery for additional tissue to obtain negative margins or for a lymph node biopsy. The patient was advised that these are the most common complications, but that others can occur as well. I discussed the risk of alteration in breast contour or size. I discussed risk of chronic pain. There are rare instances of heart/lung issues post op as well as blood clots.   They were advised against taking aspirin or other anti-inflammatory agents/blood thinners the week before surgery.   The risks and benefits of the procedure were described to the patient and she wishes to proceed.   I will also refer her to genetics for genetic testing.

## 2024-03-24 NOTE — Research (Signed)
 [employer] 2021-05 - Specimen Collection Study to Evaluate Biomarkers in Subjects with Cancer     Patient Melanie Reese was identified by Dr.Gudena as a potential candidate for the above listed study.  This Clinical Research Nurse met with Melanie Reese, [id number] on 03/24/24 in a manner and location that ensures patient privacy to discuss participation in the above listed research study.  Patient is Accompanied by husband.  Patient was previously provided with informed consent documents.  Patient confirmed they have read the informed consent documents.  As outlined in the informed consent form, this Nurse and Melanie Reese discussed the purpose of the research study, the investigational nature of the study, study procedures and requirements for study participation, potential risks and benefits of study participation, as well as alternatives to participation.  This study is not blinded or double-blinded. The patient understands participation is voluntary and they may withdraw from study participation at any time.  This study does not involve randomization.  This study does not involve an investigational drug or device. This study does not involve a placebo. Patient understands enrollment is pending full eligibility review.   Confidentiality and how the patient's information will be used as part of study participation were discussed.  Patient was informed there is reimbursement provided for their time and effort spent on trial participation.  The patient is encouraged to discuss research study participation with their insurance provider to determine what costs they may incur as part of study participation, including research related injury.    All questions were answered to patient's satisfaction.  The informed consent with embedded HIPAA language was reviewed page by page.  The patient's mental and emotional status is appropriate to provide informed consent, and the patient verbalizes an  understanding of study participation.  Patient has agreed to participate in the above listed research study and has voluntarily signed the informed consent version [employer] Approved Version 15 Jan 2024 with embedded HIPAA language, version [employer] Approved Version 15 Jan 2024 on 03/24/24 at 1:30PM.  The patient was provided with a copy of the signed informed consent form with embedded HIPAA language for their reference.  No study specific procedures were obtained prior to the signing of the informed consent document.  Approximately 30 minutes were spent with the patient reviewing the informed consent documents.  Patient was not requested to complete a Release of Information form.    Eligibility: Eligibility criteria reviewed with patient. This nurse/coordinator has reviewed this patient's inclusion and exclusion criteria and confirmed patient is eligible for study participation. Eligibility confirmed by treating investigator, Dr.Gudena , who also agrees that patient should proceed with enrollment.  Patient will continue with enrollment.  Medical History: This RN/Coordinator reviewed the medical history as reported in the patient's medical record with the participant.  In addition, the participant was asked to report any new medical conditions not previously recorded on the medical history form.   Was the current medical history form correct?   Yes Are there any new medical conditions to report?  No  Based on the review of the medical chart and the patient's responses, all reportable medical history events will be entered for study reporting purposes.     Data Collection: Patient was interviewed to collect the following information.  Does participant have a history of: High Blood Pressure   No  Has participant been diagnosed with: Coronary Artery Disease                No  Myocardial Infarction                       No Congestive Heart Failure               No Peripheral Vascular Disease           No Cerebrovascular Disease              No Chronic Pulmonary Disease             No COPD (incl Emphysema,Chronic Bronchitis)    No Lupus                               No Rheumatoid Arthritis         No Rheumatoid Disease         No  Does participant have a history of: Diabetes                  No          Has participant been diagnosed with: Dementia                         No Hemiplegia or Paraplegia No Barrett's Esophagus       No Gastric Ulcer                 Yes Peptic Ulcer Disease      No Mild Liver Disease           No Moderate or Severe Liver Disease  No Liver Cirrhosis                                 No Helicobacter Pylori (H. Pylori)          No Pancreatitis                                       No Renal Disease                                No Chronic Kidney Disease (CKD)   No  Ulcerative Colitis     No Crohn's Disease    No Colorectal Polyps   No Lynch Syndrome    No Hepatitis B or C     No   Is the participant currently taking a magnesium supplement?   No  Does the participant have a personal history of cancer (greater than 5 years ago)?  No  Does the participant have a family history of cancer in 1st or 2nd degree relatives? Yes If yes, Relationship(s) and Cancer type(s)?  Aunt- ovarian  Grand father- bone  Does the participant have history of alcohol consumption? No     Does the participant have a history of cigarette, cigar, pipe, or chewing tobacco use?  Yes  If yes, current for former? former If yes, type (Cigarette, cigar, pipe, and/or chewing tobacco)? cigarette   If former, year stopped? 1982 Number of years? 6 years Packs/number/containers per day? 5 cigarettes a day  Blood Collection: Research blood obtained by fresh venipuncture. Patient tolerated well without any adverse events.  Gift Card: $50 gift card given to patient by Agricultural Engineer for  her participation in this study.      Patient was thanked for their  participation in this study.    Mazie Larsen, RN, BSN Clinical Research Nurse [phone] 03/24/2024

## 2024-03-25 ENCOUNTER — Ambulatory Visit (HOSPITAL_BASED_OUTPATIENT_CLINIC_OR_DEPARTMENT_OTHER): Admitting: Anesthesiology

## 2024-03-25 ENCOUNTER — Encounter (HOSPITAL_BASED_OUTPATIENT_CLINIC_OR_DEPARTMENT_OTHER)
Admission: RE | Disposition: A | Discharge status: Home / Self Care | Payer: Self-pay | Source: Home / Self Care | Attending: General Surgery

## 2024-03-25 ENCOUNTER — Encounter (HOSPITAL_BASED_OUTPATIENT_CLINIC_OR_DEPARTMENT_OTHER): Payer: Self-pay | Admitting: General Surgery

## 2024-03-25 ENCOUNTER — Other Ambulatory Visit: Payer: Self-pay

## 2024-03-25 ENCOUNTER — Ambulatory Visit (HOSPITAL_BASED_OUTPATIENT_CLINIC_OR_DEPARTMENT_OTHER)
Admission: RE | Admit: 2024-03-25 | Discharge: 2024-03-25 | Disposition: A | Discharge status: Home / Self Care | Attending: General Surgery | Admitting: General Surgery

## 2024-03-25 DIAGNOSIS — C50411 Malignant neoplasm of upper-outer quadrant of right female breast: Secondary | ICD-10-CM | POA: Diagnosis not present

## 2024-03-25 DIAGNOSIS — Z1732 Human epidermal growth factor receptor 2 negative status: Secondary | ICD-10-CM | POA: Diagnosis not present

## 2024-03-25 DIAGNOSIS — Z803 Family history of malignant neoplasm of breast: Secondary | ICD-10-CM | POA: Diagnosis not present

## 2024-03-25 DIAGNOSIS — G8918 Other acute postprocedural pain: Secondary | ICD-10-CM | POA: Diagnosis not present

## 2024-03-25 DIAGNOSIS — Z170 Estrogen receptor positive status [ER+]: Secondary | ICD-10-CM | POA: Insufficient documentation

## 2024-03-25 DIAGNOSIS — C773 Secondary and unspecified malignant neoplasm of axilla and upper limb lymph nodes: Secondary | ICD-10-CM | POA: Diagnosis not present

## 2024-03-25 DIAGNOSIS — Z79899 Other long term (current) drug therapy: Secondary | ICD-10-CM | POA: Diagnosis not present

## 2024-03-25 DIAGNOSIS — R569 Unspecified convulsions: Secondary | ICD-10-CM | POA: Diagnosis not present

## 2024-03-25 DIAGNOSIS — Z87891 Personal history of nicotine dependence: Secondary | ICD-10-CM | POA: Insufficient documentation

## 2024-03-25 DIAGNOSIS — Z8041 Family history of malignant neoplasm of ovary: Secondary | ICD-10-CM | POA: Diagnosis not present

## 2024-03-25 DIAGNOSIS — N641 Fat necrosis of breast: Secondary | ICD-10-CM | POA: Diagnosis not present

## 2024-03-25 DIAGNOSIS — Z1721 Progesterone receptor positive status: Secondary | ICD-10-CM | POA: Insufficient documentation

## 2024-03-25 DIAGNOSIS — C50911 Malignant neoplasm of unspecified site of right female breast: Secondary | ICD-10-CM | POA: Diagnosis not present

## 2024-03-25 DIAGNOSIS — Z01818 Encounter for other preprocedural examination: Secondary | ICD-10-CM

## 2024-03-25 HISTORY — DX: Unspecified convulsions: R56.9

## 2024-03-25 HISTORY — PX: SENTINEL NODE BIOPSY: SHX6608

## 2024-03-25 HISTORY — PX: BREAST LUMPECTOMY: SHX2

## 2024-03-25 SURGERY — BREAST LUMPECTOMY
Anesthesia: Regional | Site: Breast | Laterality: Right

## 2024-03-25 MED ORDER — DEXAMETHASONE SOD PHOSPHATE PF 10 MG/ML IJ SOLN
INTRAMUSCULAR | Status: DC | PRN
  Administered 2024-03-25: 10 mg via PERINEURAL

## 2024-03-25 MED ORDER — FENTANYL CITRATE (PF) 100 MCG/2ML IJ SOLN
50.0000 ug | Freq: Once | INTRAMUSCULAR | Status: AC
  Administered 2024-03-25: 50 ug via INTRAVENOUS

## 2024-03-25 MED ORDER — MIDAZOLAM HCL 2 MG/2ML IJ SOLN
INTRAMUSCULAR | Status: AC
  Filled 2024-03-25: qty 2

## 2024-03-25 MED ORDER — PHENYLEPHRINE 80 MCG/ML (10ML) SYRINGE FOR IV PUSH (FOR BLOOD PRESSURE SUPPORT)
PREFILLED_SYRINGE | INTRAVENOUS | Status: DC | PRN
  Administered 2024-03-25 (×3): 160 ug via INTRAVENOUS

## 2024-03-25 MED ORDER — MIDAZOLAM HCL (PF) 2 MG/2ML IJ SOLN
1.0000 mg | Freq: Once | INTRAMUSCULAR | Status: AC
  Administered 2024-03-25: 1 mg via INTRAVENOUS

## 2024-03-25 MED ORDER — FENTANYL CITRATE (PF) 100 MCG/2ML IJ SOLN
INTRAMUSCULAR | Status: AC
  Filled 2024-03-25: qty 2

## 2024-03-25 MED ORDER — FENTANYL CITRATE (PF) 100 MCG/2ML IJ SOLN
INTRAMUSCULAR | Status: DC | PRN
  Administered 2024-03-25: 50 ug via INTRAVENOUS

## 2024-03-25 MED ORDER — DEXAMETHASONE SOD PHOSPHATE PF 10 MG/ML IJ SOLN
INTRAMUSCULAR | Status: DC | PRN
  Administered 2024-03-25: 10 mg via INTRAVENOUS

## 2024-03-25 MED ORDER — OXYCODONE HCL 5 MG PO TABS
5.0000 mg | ORAL_TABLET | Freq: Once | ORAL | Status: AC | PRN
  Administered 2024-03-25: 5 mg via ORAL

## 2024-03-25 MED ORDER — ACETAMINOPHEN 500 MG PO TABS
ORAL_TABLET | ORAL | Status: AC
  Filled 2024-03-25: qty 2

## 2024-03-25 MED ORDER — LIDOCAINE HCL (PF) 1 % IJ SOLN
INTRAMUSCULAR | Status: AC
Start: 2024-03-25 — End: 2024-03-25
  Filled 2024-03-25: qty 30

## 2024-03-25 MED ORDER — EPHEDRINE SULFATE-NACL 50-0.9 MG/10ML-% IV SOSY
PREFILLED_SYRINGE | INTRAVENOUS | Status: DC | PRN
  Administered 2024-03-25 (×4): 5 mg via INTRAVENOUS

## 2024-03-25 MED ORDER — CEFAZOLIN SODIUM-DEXTROSE 2-3 GM-%(50ML) IV SOLR
INTRAVENOUS | Status: DC | PRN
  Administered 2024-03-25: 2 g via INTRAVENOUS

## 2024-03-25 MED ORDER — AMISULPRIDE (ANTIEMETIC) 5 MG/2ML IV SOLN: 10.0000 mg | Freq: Once | INTRAVENOUS | Status: DC | PRN

## 2024-03-25 MED ORDER — CEFAZOLIN SODIUM-DEXTROSE 2-4 GM/100ML-% IV SOLN
INTRAVENOUS | Status: AC
  Filled 2024-03-25: qty 100

## 2024-03-25 MED ORDER — FENTANYL CITRATE (PF) 100 MCG/2ML IJ SOLN: 25.0000 ug | INTRAMUSCULAR | Status: DC | PRN

## 2024-03-25 MED ORDER — ACETAMINOPHEN 500 MG PO TABS
1000.0000 mg | ORAL_TABLET | ORAL | Status: AC
  Administered 2024-03-25: 1000 mg via ORAL

## 2024-03-25 MED ORDER — ONDANSETRON HCL 4 MG/2ML IJ SOLN
INTRAMUSCULAR | Status: DC | PRN
  Administered 2024-03-25: 4 mg via INTRAVENOUS

## 2024-03-25 MED ORDER — MAGTRACE LYMPHATIC TRACER
INTRAMUSCULAR | Status: DC | PRN
  Administered 2024-03-25: 2 mL via INTRAMUSCULAR

## 2024-03-25 MED ORDER — LIDOCAINE-EPINEPHRINE (PF) 1 %-1:200000 IJ SOLN
INTRAMUSCULAR | Status: AC
  Filled 2024-03-25: qty 30

## 2024-03-25 MED ORDER — 0.9 % SODIUM CHLORIDE (POUR BTL) OPTIME
TOPICAL | Status: DC | PRN
  Administered 2024-03-25: 1000 mL

## 2024-03-25 MED ORDER — BUPIVACAINE HCL 0.25 % IJ SOLN
INTRAMUSCULAR | Status: DC | PRN
  Administered 2024-03-25: 10 mL via INTRAMUSCULAR

## 2024-03-25 MED ORDER — CEFAZOLIN SODIUM-DEXTROSE 2-4 GM/100ML-% IV SOLN: 2.0000 g | INTRAVENOUS | Status: DC

## 2024-03-25 MED ORDER — LIDOCAINE 2% (20 MG/ML) 5 ML SYRINGE
INTRAMUSCULAR | Status: DC | PRN
  Administered 2024-03-25: 40 mg via INTRAVENOUS

## 2024-03-25 MED ORDER — OXYCODONE HCL 5 MG PO TABS: 5.0000 mg | ORAL_TABLET | Freq: Four times a day (QID) | ORAL | 0 refills | Status: DC | PRN

## 2024-03-25 MED ORDER — OXYCODONE HCL 5 MG PO TABS
ORAL_TABLET | ORAL | Status: AC
  Filled 2024-03-25: qty 1

## 2024-03-25 MED ORDER — OXYCODONE HCL 5 MG/5ML PO SOLN: 5.0000 mg | Freq: Once | ORAL | Status: AC | PRN

## 2024-03-25 MED ORDER — ROPIVACAINE HCL 5 MG/ML IJ SOLN
INTRAMUSCULAR | Status: DC | PRN
  Administered 2024-03-25: 30 mL via PERINEURAL

## 2024-03-25 MED ORDER — PROPOFOL 10 MG/ML IV BOLUS
INTRAVENOUS | Status: DC | PRN
  Administered 2024-03-25: 150 ug via INTRAVENOUS

## 2024-03-25 MED ORDER — LACTATED RINGERS IV SOLN: INTRAVENOUS | Status: DC

## 2024-03-25 MED ORDER — BUPIVACAINE-EPINEPHRINE (PF) 0.25% -1:200000 IJ SOLN
INTRAMUSCULAR | Status: AC
  Filled 2024-03-25: qty 30

## 2024-03-25 SURGICAL SUPPLY — 56 items
BINDER BREAST LRG (GAUZE/BANDAGES/DRESSINGS) IMPLANT
BINDER BREAST MEDIUM (GAUZE/BANDAGES/DRESSINGS) IMPLANT
BINDER BREAST XLRG (GAUZE/BANDAGES/DRESSINGS) IMPLANT
BINDER BREAST XXLRG (GAUZE/BANDAGES/DRESSINGS) IMPLANT
BLADE SURG 10 STRL SS (BLADE) ×4 IMPLANT
BNDG COHESIVE 4X5 TAN STRL LF (GAUZE/BANDAGES/DRESSINGS) ×2 IMPLANT
CANISTER SUC SOCK COL 7IN (MISCELLANEOUS) IMPLANT
CANISTER SUCT 1200ML W/VALVE (MISCELLANEOUS) ×2 IMPLANT
CHLORAPREP W/TINT 26 (MISCELLANEOUS) ×2 IMPLANT
CLIP TI LARGE 6 (CLIP) ×2 IMPLANT
CLIP TI MEDIUM 6 (CLIP) ×4 IMPLANT
CLIP TI WIDE RED SMALL 6 (CLIP) IMPLANT
COVER MAYO STAND STRL (DRAPES) ×4 IMPLANT
COVER PROBE CYLINDRICAL 5X96 (MISCELLANEOUS) ×2 IMPLANT
DERMABOND ADVANCED .7 DNX12 (GAUZE/BANDAGES/DRESSINGS) ×2 IMPLANT
DRAPE UTILITY XL STRL (DRAPES) ×2 IMPLANT
ELECT COATED BLADE 2.86 ST (ELECTRODE) ×2 IMPLANT
ELECTRODE BLDE 4.0 EZ CLN MEGD (MISCELLANEOUS) IMPLANT
ELECTRODE REM PT RTRN 9FT ADLT (ELECTROSURGICAL) ×2 IMPLANT
GAUZE PAD ABD 8X10 STRL (GAUZE/BANDAGES/DRESSINGS) ×2 IMPLANT
GAUZE SPONGE 4X4 12PLY STRL LF (GAUZE/BANDAGES/DRESSINGS) ×2 IMPLANT
GLOVE BIO SURGEON STRL SZ 6 (GLOVE) ×2 IMPLANT
GLOVE BIOGEL PI IND STRL 6.5 (GLOVE) ×2 IMPLANT
GLOVE BIOGEL PI IND STRL 7.0 (GLOVE) IMPLANT
GLOVE SURG SS PI 6.5 STRL IVOR (GLOVE) IMPLANT
GLOVE SURG SS PI 7.0 STRL IVOR (GLOVE) IMPLANT
GOWN STRL REUS W/ TWL LRG LVL3 (GOWN DISPOSABLE) ×2 IMPLANT
GOWN STRL REUS W/ TWL XL LVL3 (GOWN DISPOSABLE) ×2 IMPLANT
KIT MARKER MARGIN INK (KITS) ×2 IMPLANT
LIGHT WAVEGUIDE WIDE FLAT (MISCELLANEOUS) IMPLANT
NDL HYPO 25X1 1.5 SAFETY (NEEDLE) ×2 IMPLANT
NDL SAFETY ECLIPSE 18X1.5 (NEEDLE) ×2 IMPLANT
NEEDLE HYPO 25X1 1.5 SAFETY (NEEDLE) ×2 IMPLANT
PACK BASIN DAY SURGERY FS (CUSTOM PROCEDURE TRAY) ×2 IMPLANT
PACK UNIVERSAL I (CUSTOM PROCEDURE TRAY) ×2 IMPLANT
PENCIL SMOKE EVACUATOR (MISCELLANEOUS) ×2 IMPLANT
SLEEVE SCD COMPRESS KNEE MED (STOCKING) ×2 IMPLANT
SOLN 0.9% NACL POUR BTL 1000ML (IV SOLUTION) ×2 IMPLANT
SPIKE FLUID TRANSFER (MISCELLANEOUS) IMPLANT
SPONGE T-LAP 18X18 ~~LOC~~+RFID (SPONGE) ×4 IMPLANT
STAPLER SKIN PROX WIDE 3.9 (STAPLE) IMPLANT
STOCKINETTE IMPERVIOUS LG (DRAPES) ×2 IMPLANT
STRIP CLOSURE SKIN 1/2X4 (GAUZE/BANDAGES/DRESSINGS) ×2 IMPLANT
SUT ETHILON 2 0 FS 18 (SUTURE) IMPLANT
SUT MNCRL AB 4-0 PS2 18 (SUTURE) ×2 IMPLANT
SUT MON AB 5-0 PS2 18 (SUTURE) IMPLANT
SUT SILK 2 0 SH (SUTURE) IMPLANT
SUT VIC AB 2-0 SH 27XBRD (SUTURE) ×2 IMPLANT
SUT VICRYL 3-0 CR8 SH (SUTURE) ×4 IMPLANT
SYR BULB EAR ULCER 3OZ GRN STR (SYRINGE) ×2 IMPLANT
SYR CONTROL 10ML LL (SYRINGE) ×2 IMPLANT
TOWEL GREEN STERILE FF (TOWEL DISPOSABLE) ×2 IMPLANT
TRACER MAGTRACE VIAL (MISCELLANEOUS) IMPLANT
TRAY FAXITRON CT DISP (TRAY / TRAY PROCEDURE) ×2 IMPLANT
TUBE CONNECTING 20X1/4 (TUBING) ×2 IMPLANT
YANKAUER SUCT BULB TIP NO VENT (SUCTIONS) ×2 IMPLANT

## 2024-03-25 NOTE — Anesthesia Postprocedure Evaluation (Signed)
 Anesthesia Post Note  Patient: EMILIA KAYES  Procedure(s) Performed: BREAST LUMPECTOMY (Right: Breast) BIOPSY, LYMPH NODE, SENTINEL (Right)     Patient location during evaluation: PACU Anesthesia Type: Regional and General Level of consciousness: awake and alert Pain management: pain level controlled Vital Signs Assessment: post-procedure vital signs reviewed and stable Respiratory status: spontaneous breathing, nonlabored ventilation, respiratory function stable and patient connected to nasal cannula oxygen Cardiovascular status: blood pressure returned to baseline and stable Postop Assessment: no apparent nausea or vomiting Anesthetic complications: no   No notable events documented.  Last Vitals:  Vitals:   03/25/24 1132 03/25/24 1209  BP: (!) 143/86 (!) 149/93  Pulse: 85 83  Resp: 18 16  Temp: (!) 36.2 C (!) 36.2 C  SpO2: 98% 96%    Last Pain:  Vitals:   03/25/24 1205  TempSrc:   PainSc: 5                  Ahja Martello L Zamyah Wiesman

## 2024-03-25 NOTE — Discharge Instructions (Addendum)
 [facility] Office Phone Number [phone]  BREAST BIOPSY/ PARTIAL MASTECTOMY: POST OP INSTRUCTIONS  Always review your discharge instruction sheet given to you by the facility where your surgery was performed.  IF YOU HAVE DISABILITY OR FAMILY LEAVE FORMS, YOU MUST BRING THEM TO THE OFFICE FOR PROCESSING.  DO NOT GIVE THEM TO YOUR DOCTOR.  Take 2 tylenol (acetominophen) three times a day for 3 days.  If you still have pain, add ibuprofen with food in between if able to take this (if you have kidney issues or stomach issues, do not take ibuprofen).  If both of those are not enough, add the narcotic pain pill.  If you find you are needing a lot of this overnight after surgery, call the next morning for a refill.    Prescriptions will not be filled after 5pm or on week-ends. Take your usually prescribed medications unless otherwise directed You should eat very light the first 24 hours after surgery, such as soup, crackers, pudding, etc.  Resume your normal diet the day after surgery. Most patients will experience some swelling and bruising in the breast.  Ice packs and a good support bra will help.  Swelling and bruising can take several days to resolve.  It is common to experience some constipation if taking pain medication after surgery.  Increasing fluid intake and taking a stool softener will usually help or prevent this problem from occurring.  A mild laxative (Milk of Magnesia or Miralax) should be taken according to package directions if there are no bowel movements after 48 hours. Unless discharge instructions indicate otherwise, you may remove your bandages 48 hours after surgery, and you may shower at that time.  You may have steri-strips (small skin tapes) in place directly over the incision.  These strips should be left on the skin at least for for 7-10 days.    ACTIVITIES:  You may resume regular daily activities (gradually increasing) beginning the next day.  Wearing a  good support bra or sports bra (or the breast binder) minimizes pain and swelling.  You may have sexual intercourse when it is comfortable. No heavy lifting for 1-2 weeks (not over around 10 pounds).  You may drive when you no longer are taking prescription pain medication, you can comfortably wear a seatbelt, and you can safely maneuver your car and apply brakes. RETURN TO WORK:  __________3-14 days depending on job. _______________ Rosine should see your doctor in the office for a follow-up appointment approximately two weeks after your surgery.  Your doctor's nurse will typically make your follow-up appointment when she calls you with your pathology report.  Expect your pathology report 3-4 business days after your surgery.  You may call to check if you do not hear from us  after three days.   WHEN TO CALL YOUR DOCTOR: Fever over 101.0 Nausea and/or vomiting. Extreme swelling or bruising. Continued bleeding from incision. Increased pain, redness, or drainage from the incision.  The clinic staff is available to answer your questions during regular business hours.  Please don't hesitate to call and ask to speak to one of the nurses for clinical concerns.  If you have a medical emergency, go to the nearest emergency room or call 911.  A surgeon from [facility] is always on call at the hospital.  For further questions, please visit [email or web address]   No Tylenol before 2pm today.   Post Anesthesia Home Care Instructions  Activity: Get plenty of rest for the remainder of the  day. A responsible individual must stay with you for 24 hours following the procedure.  For the next 24 hours, DO NOT: -Drive a car -Advertising copywriter -Drink alcoholic beverages -Take any medication unless instructed by your physician -Make any legal decisions or sign important papers.  Meals: Start with liquid foods such as gelatin or soup. Progress to regular foods as tolerated. Avoid greasy,  spicy, heavy foods. If nausea and/or vomiting occur, drink only clear liquids until the nausea and/or vomiting subsides. Call your physician if vomiting continues.  Special Instructions/Symptoms: Your throat may feel dry or sore from the anesthesia or the breathing tube placed in your throat during surgery. If this causes discomfort, gargle with warm salt water. The discomfort should disappear within 24 hours.  If you had a scopolamine patch placed behind your ear for the management of post- operative nausea and/or vomiting:  1. The medication in the patch is effective for 72 hours, after which it should be removed.  Wrap patch in a tissue and discard in the trash. Wash hands thoroughly with soap and water. 2. You may remove the patch earlier than 72 hours if you experience unpleasant side effects which may include dry mouth, dizziness or visual disturbances. 3. Avoid touching the patch. Wash your hands with soap and water after contact with the patch.

## 2024-03-25 NOTE — Interval H&P Note (Signed)
 History and Physical Interval Note:  03/25/2024 8:54 AM  Melanie Reese  has presented today for surgery, with the diagnosis of RIGHT BREAST CANCER.  The various methods of treatment have been discussed with the patient and family. After consideration of risks, benefits and other options for treatment, the patient has consented to  Procedure(s) with comments:  RIGHT BREAST LUMPECTOMY RIGHT SENTINEL LYMPH NODE BIOPSY as a surgical intervention.  The patient's history has been reviewed, patient examined, no change in status, stable for surgery.  I have reviewed the patient's chart and labs.  Questions were answered to the patient's satisfaction.     Jina Nephew

## 2024-03-25 NOTE — Anesthesia Procedure Notes (Signed)
 Anesthesia Regional Block: Pectoralis block   Pre-Anesthetic Checklist: , timeout performed,  Correct Patient, Correct Site, Correct Laterality,  Correct Procedure, Correct Position, site marked,  Risks and benefits discussed,  Pre-op evaluation,  At surgeon's request and post-op pain management  Laterality: Right  Prep: Maximum Sterile Barrier Precautions used, chloraprep       Needles:  Injection technique: Single-shot  Needle Type: Echogenic Stimulator Needle     Needle Length: 9cm  Needle Gauge: 21     Additional Needles:   Procedures:,,,, ultrasound used (permanent image in chart),,    Narrative:  Start time: 03/25/2024 8:50 AM End time: 03/25/2024 8:53 AM Injection made incrementally with aspirations every 5 mL. Anesthesiologist: Niels Marien CROME, MD

## 2024-03-25 NOTE — Progress Notes (Signed)
Assisted Dr. Lanetta Inch with right, pectoralis, ultrasound guided block. Side rails up, monitors on throughout procedure. See vital signs in flow sheet. Tolerated Procedure well.

## 2024-03-25 NOTE — Anesthesia Preprocedure Evaluation (Addendum)
 Anesthesia Evaluation  Patient identified by MRN, date of birth, ID band Patient awake    Reviewed: Allergy & Precautions, NPO status , Patient's Chart, lab work & pertinent test results  Airway Mallampati: II  TM Distance: >3 FB Neck ROM: Full    Dental no notable dental hx. (+) Teeth Intact, Dental Advisory Given   Pulmonary neg pulmonary ROS   Pulmonary exam normal breath sounds clear to auscultation       Cardiovascular negative cardio ROS Normal cardiovascular exam Rhythm:Regular Rate:Normal     Neuro/Psych Seizures -, Well Controlled,   negative psych ROS   GI/Hepatic negative GI ROS, Neg liver ROS,,,  Endo/Other  negative endocrine ROS    Renal/GU negative Renal ROS  negative genitourinary   Musculoskeletal negative musculoskeletal ROS (+)    Abdominal   Peds  Hematology negative hematology ROS (+)   Anesthesia Other Findings   Reproductive/Obstetrics                              Anesthesia Physical Anesthesia Plan  ASA: 2  Anesthesia Plan: General and Regional   Post-op Pain Management: Tylenol PO (pre-op)* and Regional block*   Induction: Intravenous  PONV Risk Score and Plan: 3 and Ondansetron, Dexamethasone and Midazolam  Airway Management Planned: LMA  Additional Equipment:   Intra-op Plan:   Post-operative Plan: Extubation in OR  Informed Consent: I have reviewed the patients History and Physical, chart, labs and discussed the procedure including the risks, benefits and alternatives for the proposed anesthesia with the patient or authorized representative who has indicated his/her understanding and acceptance.     Dental advisory given  Plan Discussed with: CRNA  Anesthesia Plan Comments:          Anesthesia Quick Evaluation

## 2024-03-25 NOTE — Op Note (Signed)
 Right Breast Lumpectomy with Sentinel Node Mapping and Biopsy Procedure Note  Indications: This patient presents with history of right breast cancer with clinically negative axillary lymph node exam.  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 The risks, benefits, complications, treatment options, and expected outcomes were discussed with the patient. The possibilities of reaction to medication, pulmonary aspiration, bleeding, infection, the need for additional procedures, failure to diagnose a condition, and creating a complication requiring transfusion or operation were discussed with the patient. The patient concurred with the proposed plan, giving informed consent.  The site of surgery properly noted/marked. The patient was taken to Operating Room # 1, identified as Melanie Reese and the procedure verified as Right Breast Lumpectomy and Sentinel Node Biopsy. A Time Out was held and the above information confirmed. Magtrace was injected into the breast parenchyma around 8-9 o'clock.   After induction of anesthesia, the right arm, breast, and chest were prepped and draped in standard fashion.  The lumpectomy was performed by creating a superolateral circumareolar  incision near the mass.  Skin hooks were used to elevate the skin and the cautery was used to dissect around the mass.  Dissection was carried down to the pectoral fascia.  The specimen was marked with the margin marker paint kit.  The specimen was placed in the faxatron to confirm presence on the biopsy clip. Additional margins were taken at the cardinal directions.  Hemostasis was achieved with cautery.  Large clips  were placed on the specimen cavity edges for radiation.   The wound was irrigated and closed with a 3-0 Vicryl deep dermal interrupted and a 4-0 Monocryl subcuticular closure in layers.  Using a hand-held sentimag prob, axillary sentinel nodes were identified transcutaneously.  An oblique incision was created below the axillary hairline.  Dissection was carried through the clavipectoral fascia.  4 deep level 2 axillary sentinel nodes were removed. Lymphovascular channels were clipped.  The wound was irrigated.  Hemostasis was achieved with cautery and clips.  The axillary incision was closed with 3-0 vicryl deep dermal interrupted sutures and 4-0 monocryl subcuticular closure in layers.      Sterile dressings were applied. At the end of the operation, all sponge, instrument, and needle counts were correct.  Findings: grossly clear surgical margins and SLN #1 cps 81, #2 30, #3 and #4 palpable.  Estimated Blood Loss:  25 mL         Specimens:  Right breast mass Additional margins: superior, medial, inferior, lateral, posterior, inferior. SLN right axilla 1,2,3,4                Complications:  None; patient tolerated the procedure well.         Disposition: PACU - hemodynamically stable.         Condition: stable

## 2024-03-25 NOTE — Transfer of Care (Signed)
 Immediate Anesthesia Transfer of Care Note  Patient: Melanie Reese  Procedure(s) Performed: BREAST LUMPECTOMY (Right: Breast) BIOPSY, LYMPH NODE, SENTINEL (Right)  Patient Location: PACU  Anesthesia Type:General  Level of Consciousness: awake and alert   Airway & Oxygen Therapy: Patient Spontanous Breathing  Post-op Assessment: Report given to RN and Post -op Vital signs reviewed and stable  Post vital signs: Reviewed and stable  Last Vitals:  Vitals Value Taken Time  BP 143/86 03/25/24 11:32  Temp 36.2 C 03/25/24 11:32  Pulse 89 03/25/24 11:36  Resp 17 03/25/24 11:36  SpO2 95 % 03/25/24 11:36  Vitals shown include unfiled device data.  Last Pain:  Vitals:   03/25/24 0807  TempSrc: Temporal  PainSc: 0-No pain         Complications: No notable events documented.

## 2024-03-25 NOTE — Anesthesia Procedure Notes (Signed)
 Procedure Name: LMA Insertion Date/Time: 03/25/2024 9:33 AM  Performed by: Leotha Andrez DEL, CRNAPre-anesthesia Checklist: Patient identified, Emergency Drugs available, Suction available, Patient being monitored and Timeout performed Patient Re-evaluated:Patient Re-evaluated prior to induction Oxygen Delivery Method: Circle system utilized Preoxygenation: Pre-oxygenation with 100% oxygen Induction Type: IV induction Ventilation: Mask ventilation without difficulty LMA: LMA inserted LMA Size: 4.0 Number of attempts: 1 Placement Confirmation: breath sounds checked- equal and bilateral and positive ETCO2 Tube secured with: Tape Dental Injury: Teeth and Oropharynx as per pre-operative assessment

## 2024-03-26 ENCOUNTER — Encounter (HOSPITAL_BASED_OUTPATIENT_CLINIC_OR_DEPARTMENT_OTHER): Payer: Self-pay | Admitting: General Surgery

## 2024-03-27 LAB — SURGICAL PATHOLOGY

## 2024-03-28 ENCOUNTER — Other Ambulatory Visit: Payer: Self-pay | Admitting: General Surgery

## 2024-03-28 ENCOUNTER — Ambulatory Visit: Payer: Self-pay | Admitting: General Surgery

## 2024-03-28 NOTE — Telephone Encounter (Signed)
 Discussed path with patient.  Negative margins but 3/4 nodes.  Will likely need ALND. Will discuss with team.

## 2024-03-31 ENCOUNTER — Encounter: Payer: Self-pay | Admitting: *Deleted

## 2024-04-01 ENCOUNTER — Other Ambulatory Visit: Payer: Self-pay | Admitting: General Surgery

## 2024-04-03 ENCOUNTER — Encounter: Payer: Self-pay | Admitting: *Deleted

## 2024-04-04 ENCOUNTER — Inpatient Hospital Stay

## 2024-04-14 ENCOUNTER — Encounter (HOSPITAL_COMMUNITY): Payer: Self-pay | Admitting: General Surgery

## 2024-04-14 ENCOUNTER — Other Ambulatory Visit: Payer: Self-pay

## 2024-04-14 NOTE — Therapy (Signed)
 OUTPATIENT PHYSICAL THERAPY BREAST CANCER POST OP FOLLOW UP   Patient Name: Melanie Reese MRN: [id number] DOB:1956/02/04, 68 y.o., female Today's Date: 04/15/2024  END OF SESSION:  PT End of Session - 04/15/24 0950     Visit Number 2    Number of Visits 5    Date for Recertification  05/27/24    Authorization Type [employer]    PT Start Time 0910    PT Stop Time 0948    PT Time Calculation (min) 38 min    Activity Tolerance Patient tolerated treatment well    Behavior During Therapy WFL for tasks assessed/performed          Past Medical History:  Diagnosis Date   Arthritis    Cancer (HCC)    Neuromuscular disorder (HCC)    Seizure (HCC)    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 Last seizure was 1983 per patient.   Past Surgical History:  Procedure Laterality Date   BRAIN SURGERY     1975, 1976 plate in head and burrow from head injury   BREAST BIOPSY Right 02/22/2024   US  RT BREAST BX W LOC DEV 1ST LESION IMG BX SPEC US  GUIDE 02/22/2024 [facility]   BREAST LUMPECTOMY Right 03/25/2024   Procedure: BREAST LUMPECTOMY;  Surgeon: Aron Shoulders, MD;  Location: [facility];  Service: General;  Laterality: Right;   COLONOSCOPY     DRUG INDUCED ENDOSCOPY     SENTINEL NODE BIOPSY Right 03/25/2024   Procedure: BIOPSY, LYMPH NODE, SENTINEL;  Surgeon: Aron Shoulders, MD;  Location: [facility];  Service: General;  Laterality: Right;  GEN w/PEC BLOCK RIGHT BREAST LUMPECTOMY RIGHT SENTINEL LYMPH NODE BIOPSY   Patient Active Problem List   Diagnosis Date Noted   Malignant neoplasm of upper-outer quadrant of right breast in female, estrogen receptor positive (HCC) 03/11/2024   Metatarsalgia of both feet 03/20/2017   Knee pain, bilateral 03/20/2017    PCP: Montie Pizza, MD   REFERRING PROVIDER: Shoulders Aron, MD   REFERRING DIAG: C50.411,Z17.0 (ICD-10-CM) - Malignant neoplasm of upper-outer quadrant of right breast in female, estrogen receptor positive (HCC)      THERAPY DIAG:  Abnormal posture  Malignant neoplasm of upper-outer quadrant of right breast in female, estrogen receptor positive (HCC)  Rationale for Evaluation and Treatment: Rehabilitation  ONSET DATE: 02/25/2024   SUBJECTIVE:                                                                                                                                                                                           SUBJECTIVE STATEMENT: I haven't done the exercises lately because  one day I had shooting pains in my breast. I have been doing all of my daily things. I get occasional shooting pains around the nipple. Having an ALND tomorrow due to positive nodes. My breast has looked bruised for the last 2 days. I am not sure if my bra was too tight because I wore a different one a few days ago. (See picture in Media)  PERTINENT HISTORY:  Patient was diagnosed on 02/25/2024 with right grade 2 invasive ductal carcinoma. It measures 2.6 cm and is located in the upper-outer quadrant. It is ER/PR +, HER2 - with a Ki67 of 30%.  She is s/p a right lumpectomy with SLNB on 03/25/2024 with 3+/4 LN's. She is scheduled for ALND on 04/16/2024    PATIENT GOALS:  Reassess how my recovery is going related to arm function, pain, and swelling.  PAIN:  Are you having pain? No  PRECAUTIONS: Recent Surgery, right UE Lymphedema risk,   RED FLAGS: None   ACTIVITY LEVEL / LEISURE: most home activities except vaccuming   OBJECTIVE:   PATIENT SURVEYS:   QUICK DASH: 34%  OBSERVATIONS: Bruising noted all around areola present just last 2 days; see photo in media  POSTURE:  Forward head and rounded shoulders posture   LYMPHEDEMA ASSESSMENT:  UPPER EXTREMITY AROM/PROM:   A/PROM RIGHT   eval   RIGHT 04/15/2024  Shoulder extension 63 52  Shoulder flexion 152 153  Shoulder abduction 145 148  Shoulder internal rotation 54 67  Shoulder external rotation 75 102                          (Blank rows  = not tested)   A/PROM LEFT   eval  Shoulder extension 52  Shoulder flexion 152  Shoulder abduction 156  Shoulder internal rotation 61  Shoulder external rotation 81                          (Blank rows = not tested)   CERVICAL AROM: All within normal limits:      Percent limited  Flexion WFL  Extension WFL  Right lateral flexion WFL  Left lateral flexion WFL  Right rotation WFL  Left rotation WFL      LYMPHEDEMA ASSESSMENTS (in cm):    LANDMARK RIGHT   eval RIGHT 04/15/2024  10 cm proximal to olecranon process from proximal aspect of olecranon 30.1 30.3  Olecranon process 24.1 24.1  10 cm proximal to ulnar styloid process from proximal aspect of styloid process 21.7 22.2  Just distal to ulnar styloid process 15.4 15.3  Across hand at thumb web space 18.6 18.3  At base of 2nd digit 6.5 6.2  (Blank rows = not tested)   LANDMARK LEFT   eval  10 cm proximal to olecranon process from proximal aspect of olecranon 30.9   Olecranon process 23.8  10 cm proximal to ulnar styloid process from proximal aspect of styloid process 20.7  Just distal to ulnar styloid process 15.6  Across hand at thumb web space 17.5  At base of 2nd digit 6.1  (Blank rows = not tested)  Surgery type/Date: 03/25/2024 Right lumpectomy with SLNB Number of lymph nodes removed: 3+/4 LN's, pending lymphadenectomy on 04/16/2024 Current/past treatment (chemo, radiation, hormone therapy):  Other symptoms:  Heaviness/tightness Yes Pain Yes Pitting edema No Infections No Decreased scar mobility Yes Stemmer sign No  PATIENT EDUCATION:  Education details: discussed and demonstrated scar  massage to areolar incision but not to axillary incision until healed from surgery tomorrow, discussed prophylactic sleeve that we will help her get, SOZO screen to set up after next visit, gave video information if she wants to watch today, reviewed HEP and discussed importance taking to tightness only.. Advised to perform  in a week after surgery or if she has drains, after they are removed. Scheduled follow up in 3 weeks Person educated: Patient Education method: Explanation, Demonstration, and Handouts Education comprehension: verbalized understanding  HOME EXERCISE PROGRAM: Reviewed previously given post op HEP.   ASSESSMENT:  CLINICAL IMPRESSION: Pt is s/p Right lumpectomy with SLNB on 03/25/2024 with 3+/4 LN's. She is having surgery tomorrow for an ALND and was scheduled to return in 3 weeks for reassessment, to check incisions, recheck shoulder ROM and circumferences, measure for compression sleeve for prophylactic reasons and set up for additional visits prn. ROM is presently doing well and there is no sign of swelling. We reviewed HEP, discussed video, compression sleeve, scar massage to breast incision only. She requires additional visit or more to reassess after next surgery and to  determine needs for further physical therapy  Pt will benefit from skilled therapeutic intervention to improve on the following deficits: Decreased knowledge of precautions, impaired UE functional use, pain, decreased ROM, postural dysfunction.   PT treatment/interventions: ADL/Self care home management, [id number]- PT Re-evaluation, 97110-Therapeutic exercises, 97530- Therapeutic activity, [id number]- Neuromuscular re-education, 97535- Self Care, [id number]- Manual therapy, and Patient/Family education   GOALS: Goals reviewed with patient? Yes  GOALS MET AT EVAL:  GOALS Name Target Date Goal status  1 Pt will be able to verbalize understanding of pertinent lymphedema risk reduction practices relevant to her dx specifically related to skin care.  Baseline:  No knowledge Eval Achieved at eval  2 Pt will be able to return demo and/or verbalize understanding of the post op HEP related to regaining shoulder ROM. Baseline:  No knowledge Eval Achieved at eval  3 Pt will be able to verbalize understanding of the importance of viewing the  post op After Breast CA Class video for further lymphedema risk reduction education and therapeutic exercise.  Baseline:  No knowledge Eval Achieved at eval   LONG TERM GOALS:  (STG=LTG)  GOALS Name Target Date  Goal status  1 Pt will demonstrate she has regained full shoulder ROM and function post operatively compared to baselines.  Baseline: After next surgery on 04/16/2024 05/28/2023 INITIAL  2 Pt will be fit for compression sleeve for prophylactic reasons 05/28/2023 INITIAL  3 Pt will have understanding of lymphedema precautions and will have all questions answered 05/28/2023 INITIAL  4   INITIAL     PLAN:  PT FREQUENCY/DURATION: 1-3 visits prn  for follow up. More may be required after next visit  PLAN FOR NEXT SESSION: check ROM, incisions, set up SOZO screens,did she watch video? measure for prophylactic sleeve, set up further appts prn   [facility]  [address]  [place] [address]  [phone]  After Breast Cancer Class Video It is recommended you view the ABC class video to be educated on lymphedema risk reduction. This video lasts for about 30 minutes. It can be viewed on our website here: [email or web address]  Scar massage You can begin gentle scar massage to you incision sites. Gently place one hand on the incision and move the skin (without sliding on the skin) in various directions. Do this for a few minutes and then you can  gently massage either coconut oil or vitamin E cream into the scars.  Compression garment You should continue wearing your compression bra until you feel like you no longer have swelling.  Home exercise Program Continue doing the exercises you were given until you feel like you can do them without feeling any tightness at the end.   Walking Program Studies show that 30 minutes of walking per day (fast enough to elevate your heart rate) can  significantly reduce the risk of a cancer recurrence. If you can't walk due to other medical reasons, we encourage you to find another activity you could do (like a stationary bike or water exercise).  Posture After breast cancer surgery, people frequently sit with rounded shoulders posture because it puts their incisions on slack and feels better. If you sit like this and scar tissue forms in that position, you can become very tight and have pain sitting or standing with good posture. Try to be aware of your posture and sit and stand up tall to heal properly.  Follow up PT: It is recommended you return every 3 months for the first 3 years following surgery to be assessed on the SOZO machine for an L-Dex score. This helps prevent clinically significant lymphedema in 95% of patients. These follow up screens are 10 minute appointments that you are not billed for.  Grayce JINNY Sheldon, PT 04/15/2024, 10:39 AM

## 2024-04-14 NOTE — Progress Notes (Signed)
 SDW CALL  Patient was given pre-op instructions over the phone. The opportunity was given for the patient to ask questions. No further questions asked. Patient verbalized understanding of instructions given.   PCP - Montie Teresa COME Cardiologist - denies  PPM/ICD - denies Device Orders -  Rep Notified -   Chest x-ray - na EKG - 12/31/2017--CE-requested Stress Test - denies ECHO - denies Cardiac Cath - denies  Sleep Study - denies CPAP - no  Fasting Blood Sugar - na Checks Blood Sugar _____ times a day  Blood Thinner Instructions:na Aspirin Instructions:na  ERAS Protcol - clears until 0730 PRE-SURGERY Ensure or G2-no  COVID TEST- na   Anesthesia review: no  Patient denies shortness of breath, fever, cough and chest pain over the phone call   Special instructions:    Oral Hygiene is also important to reduce your risk of infection.  Remember - BRUSH YOUR TEETH THE MORNING OF SURGERY WITH YOUR REGULAR TOOTHPASTE

## 2024-04-15 ENCOUNTER — Ambulatory Visit: Payer: Self-pay | Attending: General Surgery

## 2024-04-15 DIAGNOSIS — C50411 Malignant neoplasm of upper-outer quadrant of right female breast: Secondary | ICD-10-CM | POA: Insufficient documentation

## 2024-04-15 DIAGNOSIS — Z483 Aftercare following surgery for neoplasm: Secondary | ICD-10-CM | POA: Diagnosis present

## 2024-04-15 DIAGNOSIS — Z170 Estrogen receptor positive status [ER+]: Secondary | ICD-10-CM | POA: Diagnosis present

## 2024-04-15 DIAGNOSIS — Z9189 Other specified personal risk factors, not elsewhere classified: Secondary | ICD-10-CM | POA: Diagnosis present

## 2024-04-15 DIAGNOSIS — R293 Abnormal posture: Secondary | ICD-10-CM | POA: Insufficient documentation

## 2024-04-15 DIAGNOSIS — M25611 Stiffness of right shoulder, not elsewhere classified: Secondary | ICD-10-CM | POA: Diagnosis present

## 2024-04-15 NOTE — Patient Instructions (Signed)
 [facility]  [address], [address]  [place] [address]  [phone]  After Breast Cancer Class Video It is recommended you view the ABC class video to be educated on lymphedema risk reduction. This video lasts for about 30 minutes. It can be viewed on our website here: [email or web address]  Scar massage You can begin gentle scar massage to you incision sites. Gently place one hand on the incision and move the skin (without sliding on the skin) in various directions. Do this for a few minutes and then you can gently massage either coconut oil or vitamin E cream into the scars.  Compression garment You should continue wearing your compression bra until you feel like you no longer have swelling.  Home exercise Program Continue doing the exercises you were given until you feel like you can do them without feeling any tightness at the end.   Walking Program Studies show that 30 minutes of walking per day (fast enough to elevate your heart rate) can significantly reduce the risk of a cancer recurrence. If you can't walk due to other medical reasons, we encourage you to find another activity you could do (like a stationary bike or water exercise).  Posture After breast cancer surgery, people frequently sit with rounded shoulders posture because it puts their incisions on slack and feels better. If you sit like this and scar tissue forms in that position, you can become very tight and have pain sitting or standing with good posture. Try to be aware of your posture and sit and stand up tall to heal properly.  Follow up PT: It is recommended you return every 3 months for the first 3 years following surgery to be assessed on the SOZO machine for an L-Dex score. This helps prevent clinically significant lymphedema in 95% of patients. These follow up screens are 10 minute appointments that you are not billed  for.

## 2024-04-16 ENCOUNTER — Encounter (HOSPITAL_COMMUNITY)
Admission: RE | Disposition: A | Discharge status: Home / Self Care | Payer: Self-pay | Source: Home / Self Care | Attending: General Surgery

## 2024-04-16 ENCOUNTER — Other Ambulatory Visit: Payer: Self-pay

## 2024-04-16 ENCOUNTER — Ambulatory Visit (HOSPITAL_COMMUNITY): Payer: Self-pay | Admitting: Anesthesiology

## 2024-04-16 ENCOUNTER — Encounter (HOSPITAL_COMMUNITY): Payer: Self-pay | Admitting: General Surgery

## 2024-04-16 ENCOUNTER — Inpatient Hospital Stay: Admitting: Hematology and Oncology

## 2024-04-16 ENCOUNTER — Ambulatory Visit (HOSPITAL_COMMUNITY)
Admission: RE | Admit: 2024-04-16 | Discharge: 2024-04-16 | Disposition: A | Discharge status: Home / Self Care | Attending: General Surgery | Admitting: General Surgery

## 2024-04-16 DIAGNOSIS — C773 Secondary and unspecified malignant neoplasm of axilla and upper limb lymph nodes: Secondary | ICD-10-CM | POA: Diagnosis not present

## 2024-04-16 DIAGNOSIS — C50411 Malignant neoplasm of upper-outer quadrant of right female breast: Secondary | ICD-10-CM | POA: Diagnosis not present

## 2024-04-16 DIAGNOSIS — C50911 Malignant neoplasm of unspecified site of right female breast: Secondary | ICD-10-CM | POA: Diagnosis not present

## 2024-04-16 HISTORY — DX: Unspecified osteoarthritis, unspecified site: M19.90

## 2024-04-16 HISTORY — DX: Myoneural disorder, unspecified: G70.9

## 2024-04-16 HISTORY — DX: Malignant (primary) neoplasm, unspecified: C80.1

## 2024-04-16 HISTORY — PX: AXILLARY LYMPH NODE DISSECTION: SHX5229

## 2024-04-16 LAB — CBC
HCT: 40.1 % (ref 36.0–46.0)
Hemoglobin: 12.4 g/dL (ref 12.0–15.0)
MCH: 30.3 pg (ref 26.0–34.0)
MCHC: 30.9 g/dL (ref 30.0–36.0)
MCV: 98 fL (ref 80.0–100.0)
Platelets: 281 K/uL (ref 150–400)
RBC: 4.09 MIL/uL (ref 3.87–5.11)
RDW: 12.8 % (ref 11.5–15.5)
WBC: 4.1 K/uL (ref 4.0–10.5)
nRBC: 0 % (ref 0.0–0.2)

## 2024-04-16 SURGERY — LYMPHADENECTOMY, AXILLARY
Anesthesia: General | Laterality: Right

## 2024-04-16 MED ORDER — BUPIVACAINE-EPINEPHRINE (PF) 0.25% -1:200000 IJ SOLN
INTRAMUSCULAR | Status: AC
  Filled 2024-04-16: qty 30

## 2024-04-16 MED ORDER — LACTATED RINGERS IV SOLN: INTRAVENOUS | Status: DC

## 2024-04-16 MED ORDER — OXYCODONE HCL 5 MG/5ML PO SOLN
ORAL | Status: AC
  Filled 2024-04-16: qty 5

## 2024-04-16 MED ORDER — CEFAZOLIN SODIUM-DEXTROSE 2-4 GM/100ML-% IV SOLN
INTRAVENOUS | Status: AC
  Filled 2024-04-16: qty 100

## 2024-04-16 MED ORDER — MIDAZOLAM HCL 2 MG/2ML IJ SOLN
INTRAMUSCULAR | Status: DC
  Filled 2024-04-16: qty 2

## 2024-04-16 MED ORDER — GABAPENTIN 100 MG PO CAPS: 100.0000 mg | ORAL_CAPSULE | ORAL | Status: AC

## 2024-04-16 MED ORDER — LIDOCAINE 2% (20 MG/ML) 5 ML SYRINGE
INTRAMUSCULAR | Status: DC | PRN
  Administered 2024-04-16: 50 mg via INTRAVENOUS

## 2024-04-16 MED ORDER — FENTANYL CITRATE (PF) 250 MCG/5ML IJ SOLN
INTRAMUSCULAR | Status: DC | PRN
  Administered 2024-04-16 (×2): 25 ug via INTRAVENOUS
  Administered 2024-04-16: 50 ug via INTRAVENOUS

## 2024-04-16 MED ORDER — LIDOCAINE HCL 1 % IJ SOLN
INTRAMUSCULAR | Status: DC | PRN
  Administered 2024-04-16: 7 mL via INTRAMUSCULAR

## 2024-04-16 MED ORDER — EPHEDRINE SULFATE-NACL 50-0.9 MG/10ML-% IV SOSY
PREFILLED_SYRINGE | INTRAVENOUS | Status: DC | PRN
  Administered 2024-04-16: 5 mg via INTRAVENOUS

## 2024-04-16 MED ORDER — OXYCODONE HCL 5 MG PO TABS: 5.0000 mg | ORAL_TABLET | Freq: Once | ORAL | Status: AC | PRN

## 2024-04-16 MED ORDER — DEXAMETHASONE SOD PHOSPHATE PF 10 MG/ML IJ SOLN
INTRAMUSCULAR | Status: DC | PRN
  Administered 2024-04-16: 4 mg via INTRAVENOUS

## 2024-04-16 MED ORDER — ACETAMINOPHEN 500 MG PO TABS: 1000.0000 mg | ORAL_TABLET | ORAL | Status: AC

## 2024-04-16 MED ORDER — ORAL CARE MOUTH RINSE
15.0000 mL | Freq: Once | OROMUCOSAL | Status: AC
  Administered 2024-04-16: 15 mL via OROMUCOSAL

## 2024-04-16 MED ORDER — OXYCODONE HCL 5 MG/5ML PO SOLN
5.0000 mg | Freq: Once | ORAL | Status: AC | PRN
  Administered 2024-04-16: 5 mg via ORAL

## 2024-04-16 MED ORDER — CHLORHEXIDINE GLUCONATE CLOTH 2 % EX PADS: 6.0000 | MEDICATED_PAD | Freq: Once | CUTANEOUS | Status: DC

## 2024-04-16 MED ORDER — LIDOCAINE 2% (20 MG/ML) 5 ML SYRINGE
INTRAMUSCULAR | Status: AC
  Filled 2024-04-16: qty 5

## 2024-04-16 MED ORDER — FENTANYL CITRATE (PF) 100 MCG/2ML IJ SOLN: 100.0000 ug | Freq: Once | INTRAMUSCULAR | Status: AC

## 2024-04-16 MED ORDER — GABAPENTIN 100 MG PO CAPS
ORAL_CAPSULE | ORAL | Status: AC
  Administered 2024-04-16: 100 mg via ORAL
  Filled 2024-04-16: qty 1

## 2024-04-16 MED ORDER — PROPOFOL 10 MG/ML IV BOLUS
INTRAVENOUS | Status: AC
  Filled 2024-04-16: qty 20

## 2024-04-16 MED ORDER — CHLORHEXIDINE GLUCONATE 0.12 % MT SOLN: 15.0000 mL | Freq: Once | OROMUCOSAL | Status: DC

## 2024-04-16 MED ORDER — LIDOCAINE HCL (PF) 1 % IJ SOLN
INTRAMUSCULAR | Status: AC
  Filled 2024-04-16: qty 30

## 2024-04-16 MED ORDER — ONDANSETRON HCL 4 MG/2ML IJ SOLN
INTRAMUSCULAR | Status: AC
  Filled 2024-04-16: qty 2

## 2024-04-16 MED ORDER — ONDANSETRON HCL 4 MG/2ML IJ SOLN: 4.0000 mg | Freq: Once | INTRAMUSCULAR | Status: DC | PRN

## 2024-04-16 MED ORDER — 0.9 % SODIUM CHLORIDE (POUR BTL) OPTIME
TOPICAL | Status: DC | PRN
  Administered 2024-04-16: 1000 mL

## 2024-04-16 MED ORDER — MIDAZOLAM HCL 2 MG/2ML IJ SOLN
INTRAMUSCULAR | Status: AC
  Filled 2024-04-16: qty 2

## 2024-04-16 MED ORDER — FENTANYL CITRATE (PF) 100 MCG/2ML IJ SOLN
INTRAMUSCULAR | Status: AC
  Administered 2024-04-16: 100 ug via INTRAVENOUS
  Filled 2024-04-16: qty 2

## 2024-04-16 MED ORDER — ONDANSETRON HCL 4 MG/2ML IJ SOLN
INTRAMUSCULAR | Status: DC | PRN
  Administered 2024-04-16: 4 mg via INTRAVENOUS

## 2024-04-16 MED ORDER — CEFAZOLIN SODIUM-DEXTROSE 2-4 GM/100ML-% IV SOLN
2.0000 g | INTRAVENOUS | Status: AC
  Administered 2024-04-16: 2 g via INTRAVENOUS

## 2024-04-16 MED ORDER — FENTANYL CITRATE (PF) 100 MCG/2ML IJ SOLN
INTRAMUSCULAR | Status: AC
  Filled 2024-04-16: qty 2

## 2024-04-16 MED ORDER — OXYCODONE HCL 5 MG PO TABS: 5.0000 mg | ORAL_TABLET | Freq: Four times a day (QID) | ORAL | 0 refills | Status: DC | PRN

## 2024-04-16 MED ORDER — FENTANYL CITRATE (PF) 100 MCG/2ML IJ SOLN
25.0000 ug | INTRAMUSCULAR | Status: DC | PRN
  Administered 2024-04-16: 25 ug via INTRAVENOUS
  Administered 2024-04-16: 50 ug via INTRAVENOUS
  Administered 2024-04-16: 25 ug via INTRAVENOUS

## 2024-04-16 MED ORDER — PROPOFOL 10 MG/ML IV BOLUS
INTRAVENOUS | Status: DC | PRN
  Administered 2024-04-16: 50 mg via INTRAVENOUS
  Administered 2024-04-16: 150 mg via INTRAVENOUS

## 2024-04-16 MED ORDER — PHENYLEPHRINE 80 MCG/ML (10ML) SYRINGE FOR IV PUSH (FOR BLOOD PRESSURE SUPPORT)
PREFILLED_SYRINGE | INTRAVENOUS | Status: DC | PRN
  Administered 2024-04-16 (×2): 80 ug via INTRAVENOUS

## 2024-04-16 MED ORDER — BUPIVACAINE-EPINEPHRINE (PF) 0.5% -1:200000 IJ SOLN
INTRAMUSCULAR | Status: DC | PRN
  Administered 2024-04-16: 30 mL

## 2024-04-16 MED ORDER — ACETAMINOPHEN 500 MG PO TABS
ORAL_TABLET | ORAL | Status: AC
  Administered 2024-04-16: 1000 mg via ORAL
  Filled 2024-04-16: qty 2

## 2024-04-16 MED ORDER — CHLORHEXIDINE GLUCONATE 0.12 % MT SOLN
OROMUCOSAL | Status: DC
  Filled 2024-04-16: qty 15

## 2024-04-16 SURGICAL SUPPLY — 40 items
BIOPATCH RED 1 DISK 7.0 (GAUZE/BANDAGES/DRESSINGS) IMPLANT
BNDG COHESIVE 6X5 TAN ST LF (GAUZE/BANDAGES/DRESSINGS) ×1 IMPLANT
CANISTER SUCTION 3000ML PPV (SUCTIONS) IMPLANT
CHLORAPREP W/TINT 26 (MISCELLANEOUS) ×1 IMPLANT
CLIP TI MEDIUM 24 (CLIP) IMPLANT
CLIP TI MEDIUM 6 (CLIP) ×1 IMPLANT
CNTNR URN SCR LID CUP LEK RST (MISCELLANEOUS) IMPLANT
COVER MAYO STAND STRL (DRAPES) ×1 IMPLANT
COVER PROBE W GEL 5X96 (DRAPES) IMPLANT
COVER SURGICAL LIGHT HANDLE (MISCELLANEOUS) ×1 IMPLANT
DERMABOND ADVANCED .7 DNX12 (GAUZE/BANDAGES/DRESSINGS) ×1 IMPLANT
DRAIN CHANNEL 19F RND (DRAIN) IMPLANT
DRSG TEGADERM 4X4.75 (GAUZE/BANDAGES/DRESSINGS) IMPLANT
ELECTRODE REM PT RTRN 9FT ADLT (ELECTROSURGICAL) ×1 IMPLANT
EVACUATOR SILICONE 100CC (DRAIN) IMPLANT
GAUZE 4X4 16PLY ~~LOC~~+RFID DBL (SPONGE) ×1 IMPLANT
GAUZE SPONGE 4X4 12PLY STRL (GAUZE/BANDAGES/DRESSINGS) ×1 IMPLANT
GLOVE BIO SURGEON STRL SZ 6 (GLOVE) ×1 IMPLANT
GLOVE INDICATOR 6.5 STRL GRN (GLOVE) ×1 IMPLANT
GOWN STRL REUS W/ TWL LRG LVL3 (GOWN DISPOSABLE) ×1 IMPLANT
GOWN STRL REUS W/ TWL XL LVL3 (GOWN DISPOSABLE) ×2 IMPLANT
KIT BASIN OR (CUSTOM PROCEDURE TRAY) ×1 IMPLANT
KIT TURNOVER KIT B (KITS) ×1 IMPLANT
LIGHT WAVEGUIDE WIDE FLAT (MISCELLANEOUS) IMPLANT
NDL FILTER BLUNT 18X1 1/2 (NEEDLE) IMPLANT
NDL HYPO 25GX1X1/2 BEV (NEEDLE) IMPLANT
PACK GENERAL/GYN (CUSTOM PROCEDURE TRAY) ×1 IMPLANT
PACK UNIVERSAL I (CUSTOM PROCEDURE TRAY) ×1 IMPLANT
PAD ARMBOARD POSITIONER FOAM (MISCELLANEOUS) ×2 IMPLANT
PENCIL SMOKE EVACUATOR (MISCELLANEOUS) ×1 IMPLANT
SOLN 0.9% NACL POUR BTL 1000ML (IV SOLUTION) ×1 IMPLANT
STRIP CLOSURE SKIN 1/2X4 (GAUZE/BANDAGES/DRESSINGS) ×1 IMPLANT
SUT ETHILON 2 0 FS 18 (SUTURE) IMPLANT
SUT MON AB 4-0 PC3 18 (SUTURE) ×1 IMPLANT
SUT SILK 2 0 PERMA HAND 18 BK (SUTURE) ×1 IMPLANT
SUT VIC AB 2-0 SH 27XBRD (SUTURE) IMPLANT
SUT VIC AB 3-0 SH 27X BRD (SUTURE) ×1 IMPLANT
SUT VIC AB 3-0 SH 8-18 (SUTURE) IMPLANT
SYR CONTROL 10ML LL (SYRINGE) IMPLANT
TOWEL GREEN STERILE FF (TOWEL DISPOSABLE) ×1 IMPLANT

## 2024-04-16 NOTE — Transfer of Care (Signed)
 Immediate Anesthesia Transfer of Care Note  Patient: Melanie Reese  Procedure(s) Performed: LYMPHADENECTOMY, AXILLARY (Right)  Patient Location: PACU  Anesthesia Type:General  Level of Consciousness: awake, alert , oriented, and patient cooperative  Airway & Oxygen Therapy: Patient Spontanous Breathing and Patient connected to face mask oxygen  Post-op Assessment: Report given to RN and Post -op Vital signs reviewed and stable  Post vital signs: Reviewed and stable  Last Vitals:  Vitals Value Taken Time  BP 151/80 04/16/24 13:51  Temp    Pulse 69 04/16/24 13:57  Resp 11 04/16/24 13:57  SpO2 100 % 04/16/24 13:57  Vitals shown include unfiled device data.  Last Pain:  Vitals:   04/16/24 0907  TempSrc:   PainSc: 1          Complications: There were no known notable events for this encounter.

## 2024-04-16 NOTE — Interval H&P Note (Signed)
 History and Physical Interval Note:  04/16/2024 9:48 AM  Melanie Reese  has presented today for surgery, with the diagnosis of RIGHT BREAST CANCER.  She was found to have 3 positive nodes on SLN bx.  The various methods of treatment have been discussed with the patient and family. After consideration of risks, benefits and other options for treatment, the patient has consented to  Procedure(s) with comments: LYMPHADENECTOMY, AXILLARY (Right) - RIGHT AXILLARY LYMPH NODE DISSECTION as a surgical intervention.  The patient's history has been reviewed, patient examined, no change in status, stable for surgery.  I have reviewed the patient's chart and labs.  Questions were answered to the patient's satisfaction.     Jina Nephew

## 2024-04-16 NOTE — Discharge Instructions (Addendum)
 [facility] [phone]  AXILLARY LYMPH NODE DISSECTION: POST OP INSTRUCTIONS  Always review your discharge instruction sheet given to you by the facility where your surgery was performed. IF YOU HAVE DISABILITY OR FAMILY LEAVE FORMS, YOU MUST BRING THEM TO THE OFFICE FOR PROCESSING.   DO NOT GIVE THEM TO YOUR DOCTOR. Take 2 tylenol  (acetominophen) three times a day for 3 days.  If you still have pain, add ibuprofen with food in between if able to take this (if you have kidney  issues or stomach issues, do not take ibuprofen).  There is also a muscle relaxant for muscle spasms.    I have written a script for gabapentin .  This is helpful for burning type pain and is useful for several weeks to take twice daily.   If both of those are not enough, add the narcotic pain pill.    If you find you are needing a lot of this overnight after surgery, call the next morning for a refill.    Take your usually prescribed medications unless otherwise directed. If you need a refill on your pain medication, please contact your pharmacy.  They will contact our office to request authorization.  Prescriptions will not be filled after 5pm or on week-ends. You should follow a light diet the first few days after arrival home, such as soup and crackers, etc.  Resume your normal diet the day after surgery. Most patients will experience some swelling and bruising on the chest and underarm.  Ice packs will help.  Swelling and bruising can take several days to resolve.  It is common to experience some constipation if taking pain medication after surgery.  Increasing fluid intake and taking a stool softener (such as Colace) will usually help or prevent this problem from occurring.  A mild laxative (Milk of Magnesia or Miralax) should be taken according to package instructions if there are no bowel movements after 48 hours. You may remove the gauze from under the binder and replace as needed.  Keep  the drains pinned under the binder unless you are emptying them.  SHORT showers only are permitted after post op day 3.  DRAINS:  If you have drains in place, it is important to keep a list of the amount of drainage produced each day in your drains.  Before leaving the hospital, you should be instructed on drain care.  Call our office if you have any questions about your drains. ACTIVITIES:  You may resume regular (light) daily activities beginning the next day--such as daily self-care, walking, climbing stairs--gradually increasing activities as tolerated.  You may have sexual intercourse when it is comfortable.  Refrain from any heavy lifting or straining until approved by your doctor. You may drive when you are no longer taking prescription pain medication, you can comfortably wear a seatbelt, and you can safely maneuver your car and apply brakes. RETURN TO WORK:  ____________TBD______________________________________________ Melanie Reese should see your doctor in the office for a follow-up appointment approximately 3-5 days after your surgery.  Your doctor's nurse will typically make your follow-up appointment when she calls you with your pathology report.  Expect your pathology report 2-3 business days after your surgery.  You may call to check if you do not hear from us  after three days.   OTHER INSTRUCTIONS: ______________________________________________________________________________________________ ____________________________________________________________________________________________ WHEN TO CALL YOUR DOCTOR: Fever over 101.0 Nausea and/or vomiting Extreme swelling or bruising Continued bleeding from incision. Increased pain, redness, or drainage from the incision. The clinic staff  is available to answer your questions during regular business hours.  Please don't hesitate to call and ask to speak to one of the nurses for clinical concerns.  If you have a medical emergency, go to the nearest  emergency room or call 911.  A surgeon from [facility] is always on call at the hospital. [address], [address], [place], [place]  [address] ? [address] [address], [place], [place]   [address] [phone] ? [phone] ? FAX [phone]

## 2024-04-16 NOTE — Anesthesia Preprocedure Evaluation (Addendum)
 Anesthesia Evaluation  Patient identified by MRN, date of birth, ID band Patient awake    Reviewed: Allergy & Precautions, NPO status , Patient's Chart, lab work & pertinent test results  History of Anesthesia Complications Negative for: history of anesthetic complications  Airway Mallampati: II  TM Distance: >3 FB Neck ROM: Full    Dental  (+) Dental Advisory Given   Pulmonary neg pulmonary ROS   Pulmonary exam normal        Cardiovascular negative cardio ROS Normal cardiovascular exam     Neuro/Psych Seizures -, Well Controlled,   Neuromuscular disease  negative psych ROS   GI/Hepatic negative GI ROS, Neg liver ROS,,,  Endo/Other   Obesity   Renal/GU negative Renal ROS     Musculoskeletal  (+) Arthritis ,    Abdominal   Peds  Hematology negative hematology ROS (+)   Anesthesia Other Findings   Reproductive/Obstetrics  Breast cancer                               Anesthesia Physical Anesthesia Plan  ASA: 2  Anesthesia Plan: General   Post-op Pain Management: Tylenol  PO (pre-op)* and Regional block*   Induction: Intravenous  PONV Risk Score and Plan: 3 and Treatment may vary due to age or medical condition, Ondansetron  and Dexamethasone   Airway Management Planned: LMA  Additional Equipment: None  Intra-op Plan:   Post-operative Plan: Extubation in OR  Informed Consent: I have reviewed the patients History and Physical, chart, labs and discussed the procedure including the risks, benefits and alternatives for the proposed anesthesia with the patient or authorized representative who has indicated his/her understanding and acceptance.     Dental advisory given  Plan Discussed with: CRNA and Anesthesiologist  Anesthesia Plan Comments:          Anesthesia Quick Evaluation

## 2024-04-16 NOTE — Op Note (Signed)
 PRE-OPERATIVE DIAGNOSIS: pT2N1a right breast cancer, +/+/-  POST-OPERATIVE DIAGNOSIS:  Same  PROCEDURE:  Procedure(s): Right axillary lymph node dissection  SURGEON:  Surgeon(s): Jina Nephew, MD  ANESTHESIA:   regional and general  DRAINS: (19 Fr) Blake drain(s) in the right chest wall   LOCAL MEDICATIONS USED:  BUPIVICAINE  and LIDOCAINE    SPECIMEN:  Source of Specimen:  right axillary contents  DISPOSITION OF SPECIMEN:  PATHOLOGY  COUNTS:  YES  DICTATION: .Dragon Dictation  PLAN OF CARE: Discharge to home after PACU  PATIENT DISPOSITION:  PACU - hemodynamically stable.  FINDINGS:  no grossly enlarged residual nodes.    EBL: min  PROCEDURE:   The patient was seen in the Holding Room. The risks, benefits, complications, treatment options, and expected outcomes were discussed with the patient. The possibilities of reaction to medication, pulmonary aspiration, bleeding, infection, the need for additional procedures, failure to diagnose a condition, and creating a complication requiring transfusion or operation were discussed with the patient. The patient concurred with the proposed plan, giving informed consent.  The site of surgery properly noted/marked. The patient was taken to Operating Room # 2, identified, and the procedure verified as right axillary lymph node dissection. A Time Out was held and the above information confirmed.  An curvalinear incision was made in the axilla. An axillary dissection was performed with removal of the associated lymph nodes and surrounding adipose tissue. This included levels I and II. This was accomplished by exposing the axillary vein anteriorly and inferiorly to the level of the pectoralis minor and laterally over the latissimus dorsi muscle. Posteriorly, the dissection continued to the subscapularis.  Small venous tributaries, lymphatics, and vessels were clipped and ligated or cauterized and divided. The subscapularis muscle was  skeletonized. The long thoracic and thoracodorsal neurovascular bundles were identified and preserved.  A 19 Fr blake drain was placed into the cavity and secured at the lateral chest wall with a 2-0 nylon.  The wound was irrigated and closed with a 3-0 Vicryl deep dermal interrupted and a 4-0 vicryl subcuticular closure in layers.      Sterile dressings were applied. At the end of the operation, all sponge, instrument, and needle counts were correct. Pt was allowed to emerge from anesthesia and taken to the PACU in stable condition.

## 2024-04-16 NOTE — Anesthesia Procedure Notes (Signed)
 Procedure Name: LMA Insertion Date/Time: 04/16/2024 11:51 AM  Performed by: Erick Fitz, CRNAPre-anesthesia Checklist: Patient identified, Emergency Drugs available, Suction available, Patient being monitored and Timeout performed Patient Re-evaluated:Patient Re-evaluated prior to induction Oxygen Delivery Method: Circle system utilized Preoxygenation: Pre-oxygenation with 100% oxygen Induction Type: IV induction Ventilation: Mask ventilation without difficulty LMA: LMA inserted LMA Size: 4.0 Number of attempts: 1 Placement Confirmation: positive ETCO2 and CO2 detector Tube secured with: Tape (secured with 1/2 white silk tape) Dental Injury: Teeth and Oropharynx as per pre-operative assessment

## 2024-04-16 NOTE — Anesthesia Postprocedure Evaluation (Signed)
 Anesthesia Post Note  Patient: Melanie Reese  Procedure(s) Performed: LYMPHADENECTOMY, AXILLARY (Right)     Patient location during evaluation: PACU Anesthesia Type: General Level of consciousness: awake and alert Pain management: pain level controlled Vital Signs Assessment: post-procedure vital signs reviewed and stable Respiratory status: spontaneous breathing, nonlabored ventilation and respiratory function stable Cardiovascular status: stable and blood pressure returned to baseline Anesthetic complications: no   There were no known notable events for this encounter.  Last Vitals:  Vitals:   04/16/24 1400 04/16/24 1415  BP: (!) 151/92 (!) 154/86  Pulse: 70 71  Resp: (!) 9 11  Temp:    SpO2: 98% 97%    Last Pain:  Vitals:   04/16/24 1351  TempSrc:   PainSc: 5                  Ned FORBES Like

## 2024-04-16 NOTE — Anesthesia Procedure Notes (Signed)
 Anesthesia Regional Block: Pectoralis block   Pre-Anesthetic Checklist: , timeout performed,  Correct Patient, Correct Site, Correct Laterality,  Correct Procedure, Correct Position, site marked,  Risks and benefits discussed,  Surgical consent,  Pre-op evaluation,  At surgeon's request and post-op pain management  Laterality: Right  Prep: chloraprep       Needles:  Injection technique: Single-shot  Needle Type: Echogenic Needle     Needle Length: 10cm  Needle Gauge: 21     Additional Needles:   Narrative:  Start time: 04/16/2024 11:10 AM End time: 04/16/2024 11:13 AM Injection made incrementally with aspirations every 5 mL.  Performed by: Personally  Anesthesiologist: Lucious Debby BRAVO, MD  Additional Notes: No pain on injection. No increased resistance to injection. Injection made in 5cc increments. Good needle visualization. Patient tolerated the procedure well.

## 2024-04-17 ENCOUNTER — Encounter (HOSPITAL_COMMUNITY): Payer: Self-pay | Admitting: General Surgery

## 2024-04-17 ENCOUNTER — Inpatient Hospital Stay

## 2024-04-18 ENCOUNTER — Ambulatory Visit: Payer: Self-pay | Admitting: General Surgery

## 2024-04-18 LAB — SURGICAL PATHOLOGY

## 2024-04-18 NOTE — Telephone Encounter (Signed)
 Discussed path with patient.  Patient has follow up 12/16

## 2024-04-21 ENCOUNTER — Encounter: Payer: Self-pay | Admitting: *Deleted

## 2024-04-29 ENCOUNTER — Inpatient Hospital Stay: Attending: Hematology and Oncology | Admitting: Hematology and Oncology

## 2024-04-29 ENCOUNTER — Other Ambulatory Visit: Payer: Self-pay | Admitting: General Surgery

## 2024-04-29 VITALS — BP 126/67 | HR 75 | Temp 97.7°F | Resp 18 | Wt 156.3 lb

## 2024-04-29 DIAGNOSIS — C50411 Malignant neoplasm of upper-outer quadrant of right female breast: Secondary | ICD-10-CM | POA: Diagnosis present

## 2024-04-29 DIAGNOSIS — Z170 Estrogen receptor positive status [ER+]: Secondary | ICD-10-CM | POA: Insufficient documentation

## 2024-04-29 DIAGNOSIS — Z17411 Hormone receptor positive with human epidermal growth factor receptor 2 negative status: Secondary | ICD-10-CM | POA: Insufficient documentation

## 2024-04-29 MED ORDER — ONDANSETRON HCL 8 MG PO TABS: ORAL_TABLET | ORAL | 1 refills | Status: AC

## 2024-04-29 MED ORDER — DEXAMETHASONE 4 MG PO TABS: ORAL_TABLET | ORAL | 1 refills | Status: AC

## 2024-04-29 MED ORDER — PROCHLORPERAZINE MALEATE 10 MG PO TABS: 10.0000 mg | ORAL_TABLET | Freq: Four times a day (QID) | ORAL | 1 refills | Status: AC | PRN

## 2024-04-29 MED ORDER — LIDOCAINE-PRILOCAINE 2.5-2.5 % EX CREA: TOPICAL_CREAM | CUTANEOUS | 3 refills | Status: AC

## 2024-04-29 NOTE — Assessment & Plan Note (Signed)
 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 I discussed the margins as well as lymph node surgeries. We also discussed the final staging along with previously performed ER/PR and HER-2/neu testing.  Recommendations: 1. Adjuvant chemotherapy with dose dense Adriamycin and Cytoxan followed by Taxol 2. Adjuvant radiation therapy followed by 4. Adjuvant antiestrogen therapy ------------------------------------------------------------------------------------------------------------------------------------------------ Chemotherapy Counseling: I discussed the risks and benefits of chemotherapy including the risks of nausea/ vomiting, risk of infection from low WBC count, fatigue due to chemo or anemia, bruising or bleeding due to low platelets, mouth sores, loss/ change in taste and decreased appetite. Liver and kidney function will be monitored through out chemotherapy as abnormalities in liver and kidney function may be a side effect of treatment. Cardiac dysfunction due to Adriamycin and neuropathy risk from Taxol were discussed in detail. Risk of permanent bone marrow dysfunction and leukemia due to chemo were also discussed.  Plan: Echo, chemo class, start chemotherapy in 2 weeks

## 2024-04-29 NOTE — Progress Notes (Signed)
 Patient Care Team: Teresa Channel, MD as PCP - General (Family Medicine) Gerome, Devere HERO, RN as Registered Nurse Tyree Nanetta SAILOR, RN as Oncology Nurse Navigator Odean Potts, MD as Consulting Physician (Hematology and Oncology) Maritza Stagger, MD as Consulting Physician (Radiation Oncology) Aron Shoulders, MD as Consulting Physician (Surgical Oncology)  DIAGNOSIS:  Encounter Diagnosis  Name Primary?   Malignant neoplasm of upper-outer quadrant of right breast in female, estrogen receptor positive (HCC) Yes    SUMMARY OF ONCOLOGIC HISTORY: Oncology History  Malignant neoplasm of upper-outer quadrant of right breast in female, estrogen receptor positive (HCC)  02/22/2024 Initial Diagnosis   Patient had a palpable lump in the right breast measuring 2.6 cm by ultrasound biopsy revealed 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%, 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, axilla negative   03/12/2024 Cancer Staging   Staging form: Breast, AJCC 8th Edition - Clinical: Stage IB (cT2, cN0, cM0, G2, ER+, PR+, HER2-) - Signed by Odean Potts, MD on 03/12/2024 Stage prefix: Initial diagnosis Histologic grading system: 3 grade system   03/25/2024 Surgery   Right lumpectomy: 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, margins negative, 3/4 lymph nodes positive 04/16/2024: ALND: 2/29 lymph nodes positive (overall 5/33 lymph nodes)   04/29/2024 Cancer Staging   Staging form: Breast, AJCC 8th Edition - Pathologic stage from 04/29/2024: Stage IB (pT2, pN2, cM0, G2, ER+, PR+, HER2-) - Signed by Odean Potts, MD on 04/29/2024 Histologic grading system: 3 grade system     CHIEF COMPLIANT: Follow-up to discuss her treatment plan after surgery  HISTORY OF PRESENT ILLNESS:  History of Present Illness Melanie Reese is a 68 year old female with newly diagnosed right breast invasive ductal carcinoma, ER+, pT2N2, status post lumpectomy and axillary dissection, presenting for oncology consultation to initiate adjuvant ACT  chemotherapy.  She underwent lumpectomy and axillary dissection on April 16, 2024, with removal of about thirty-three lymph nodes, five positive for malignancy. She is recovering well from surgery and is relieved to have her drain removed.  She is scheduled to begin adjuvant ACT chemotherapy in early January, with Adriamycin and Cyclophosphamide every two weeks for four cycles followed by weekly Taxol for twelve weeks, pending port placement, whole body scan, and baseline echocardiogram.  She has reviewed expected chemotherapy toxicities including alopecia, nausea, myelosuppression, fatigue, taste changes, and weight gain. She is concerned about hair loss but is accepting given prior alopecia.  She notes tingling in her hands since surgery and denies prior neuropathy, diabetes, or peripheral vascular disease.  She has anemia with a prior transfusion for a bleeding ulcer and reports her hemoglobin is usually low. She denies cardiac symptoms.  She is concerned about weight gain during chemotherapy, recognizes a tendency toward comfort eating, and has received dietary counseling to increase protein and reduce carbohydrates and sweets.  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 She is reassured that chemotherapy and radiation will not affect it.  She is concerned about familial risk and has an upcoming genetic counseling appointment.       ALLERGIES:  is allergic to doxycycline, nsaids, chlorhexidine  gluconate, and sulfa antibiotics.  MEDICATIONS:  Current Outpatient Medications  Medication Sig Dispense Refill   acetaminophen  (TYLENOL ) 500 MG tablet Take 500 mg by mouth every 6 (six) hours as needed for moderate pain (pain score 4-6).     Cholecalciferol 125 MCG (5000 UT) TABS Take 5,000 Units by mouth daily.  cyanocobalamin  2000 MCG tablet Take 2,000 mcg by mouth daily.     fluticasone (FLONASE) 50 MCG/ACT nasal spray  Place 1 spray into both nostrils daily.     oxyCODONE  (OXY IR/ROXICODONE ) 5 MG immediate release tablet Take 1 tablet (5 mg total) by mouth every 6 (six) hours as needed for severe pain (pain score 7-10). 8 tablet 0   PHENobarbital  (LUMINAL) 97.2 MG tablet Take 97.2 mg by mouth at bedtime.     triamterene-hydrochlorothiazide (MAXZIDE-25) 37.5-25 MG tablet Take 1 tablet by mouth daily as needed (swelling).     No current facility-administered medications for this visit.    PHYSICAL EXAMINATION: ECOG PERFORMANCE STATUS: 1 - Symptomatic but completely ambulatory  Vitals:   04/29/24 1454  BP: 126/67  Pulse: 75  Resp: 18  Temp: 97.7 F (36.5 C)  SpO2: 100%   Filed Weights   04/29/24 1454  Weight: 156 lb 4.8 oz (70.9 kg)    LABORATORY DATA:  I have reviewed the data as listed    Latest Ref Rng & Units 03/20/2024   12:55 PM  CMP  Glucose 70 - 99 mg/dL 86   BUN 8 - 23 mg/dL 13   Creatinine 9.55 - 1.00 mg/dL 9.05   Sodium 864 - 854 mmol/L 139   Potassium 3.5 - 5.1 mmol/L 4.1   Chloride 98 - 111 mmol/L 104   CO2 22 - 32 mmol/L 24   Calcium 8.9 - 10.3 mg/dL 9.0     Lab Results  Component Value Date   WBC 4.1 04/16/2024   HGB 12.4 04/16/2024   HCT 40.1 04/16/2024   MCV 98.0 04/16/2024   PLT 281 04/16/2024    ASSESSMENT & PLAN:  Malignant neoplasm of upper-outer quadrant of right breast in female, estrogen receptor positive (HCC) 02/22/2024:Patient had a palpable lump in the right breast measuring 2.6 cm by ultrasound biopsy revealed 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%, 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, axilla negative   03/25/2024: Right lumpectomy: 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, margins negative, 3/4 lymph nodes positive 04/16/2024: ALND: 2/29 lymph nodes positive (overall 5/33 lymph nodes)  Pathology counseling: I discussed the final pathology report of the patient provided  a copy of this report. I discussed the margins as well as lymph node surgeries. We also discussed the final staging  along with previously performed ER/PR and HER-2/neu testing.  Recommendations: 1. Adjuvant chemotherapy with dose dense Adriamycin and Cytoxan followed by Taxol 2. Adjuvant radiation therapy followed by 4. Adjuvant antiestrogen therapy ------------------------------------------------------------------------------------------------------------------------------------------------ Chemotherapy Counseling: I discussed the risks and benefits of chemotherapy including the risks of nausea/ vomiting, risk of infection from low WBC count, fatigue due to chemo or anemia, bruising or bleeding due to low platelets, mouth sores, loss/ change in taste and decreased appetite. Liver and kidney function will be monitored through out chemotherapy as abnormalities in liver and kidney function may be a side effect of treatment. Cardiac dysfunction due to Adriamycin and neuropathy risk from Taxol were discussed in detail. Risk of permanent bone marrow dysfunction and leukemia due to chemo were also discussed.  Plan: CT CAP, port placement, echo, chemo class, start chemotherapy in 2-3 weeks      Orders Placed This Encounter  Procedures   CT CHEST ABDOMEN PELVIS W CONTRAST    Standing Status:   Future    Expected Date:   05/06/2024    Expiration Date:   04/29/2025    If indicated for the ordered procedure, I authorize  the administration of contrast media per Radiology protocol:   Yes    Does the patient have a contrast media/X-ray dye allergy?:   No    Preferred imaging location?:   [facility]    Release to patient:   Immediate    If indicated for the ordered procedure, I authorize the administration of oral contrast media per Radiology protocol:   No    Reason for no oral contrast::   breast   ECHOCARDIOGRAM COMPLETE    Standing Status:   Future    Expiration Date:   04/29/2025    Where should this test be performed:   [facility]    Perflutren DEFINITY (image enhancing agent) should be  administered unless hypersensitivity or allergy exist:   Administer Perflutren    Reason for exam-Echo:   Chemo  Z09    Release to patient:   Immediate   The patient has a good understanding of the overall plan. she agrees with it. she will call with any problems that may develop before the next visit here.  I personally spent a total of 45 minutes in the care of the patient today including preparing to see the patient, getting/reviewing separately obtained history, performing a medically appropriate exam/evaluation, counseling and educating, placing orders, referring and communicating with other health care professionals, documenting clinical information in the EHR, independently interpreting results, communicating results, and coordinating care.   Viinay K Osa Fogarty, MD 04/29/2024

## 2024-04-29 NOTE — Progress Notes (Signed)
 START ON PATHWAY REGIMEN - Breast     Cycles 1 through 4: A cycle is every 14 days:     Doxorubicin      Cyclophosphamide      [id number]-[id number]    Cycles 5 through 16: A cycle is every 7 days:     Paclitaxel   **Always confirm dose/schedule in your pharmacy ordering system**  Patient Characteristics: Postoperative without Neoadjuvant Therapy, M0 (Pathologic Staging), Invasive Disease, Adjuvant Therapy, HER2 Negative, ER Positive, Node Positive, Node Positive (4+) Therapeutic Status: Postoperative without Neoadjuvant Therapy, M0 (Pathologic Staging) AJCC T Category: pT2 AJCC N Category: pN2a AJCC M Category: cM0 AJCC Grade: G2 ER Status: Positive (+) PR Status: Positive (+) HER2 Status: Negative (-) Oncotype Dx Recurrence Score: Not Appropriate AJCC 8 Stage Grouping: IB Intent of Therapy: Curative Intent, Discussed with Patient

## 2024-04-30 ENCOUNTER — Encounter: Payer: Self-pay | Admitting: Hematology and Oncology

## 2024-04-30 ENCOUNTER — Other Ambulatory Visit: Payer: Self-pay

## 2024-04-30 ENCOUNTER — Telehealth: Payer: Self-pay | Admitting: *Deleted

## 2024-04-30 NOTE — Telephone Encounter (Signed)
 error

## 2024-05-01 ENCOUNTER — Encounter: Payer: Self-pay | Admitting: *Deleted

## 2024-05-01 NOTE — Progress Notes (Deleted)
 [facility]        Telephone: [phone]?Fax: [phone]   Oncology Clinical Pharmacist Practitioner Initial Assessment   Melanie Reese is a 68 y.o. female with a diagnosis of breast cancer. They were contacted today via in-person visit.  Indication/Regimen Doxorubicin (Adriamycin) and cyclophosphamide (Cytoxan) followed by paclitaxel (Taxol) are being used appropriately for treatment of breast cancer by Dr. Vinay Gudena.      Wt Readings from Last 1 Encounters:  04/29/24 156 lb 4.8 oz (70.9 kg)    Estimated body surface area is 1.73 meters squared as calculated from the following:   Height as of 04/16/24: 5' (1.524 m).   Weight as of 04/29/24: 156 lb 4.8 oz (70.9 kg).  The dosing regimen is every 14 days for 4 cycles  Doxorubicin (60 mg/m2) on Day 1 Cyclophosphamide (600 mg/m2) on Day 1 Pegfilgrastim (6 mg) on Day 3  Followed by a dosing regimen that is every 7 days for 12 cycles  Paclitaxel (80 mg/m2) on Day 1  It is planned to continue until treatment plan completion or unacceptable toxicity. The tentative start date is: 05/20/24  Dose Modifications None   Allergies Allergies[1]  Vitals    04/29/2024    2:54 PM 04/16/2024    3:15 PM 04/16/2024    3:00 PM  Oncology Vitals  Weight 70.897 kg    Weight (lbs) 156 lbs 5 oz    BMI 30.53 kg/m2    Temp 97.7 F (36.5 C)    Pulse Rate 75 65 68  BP 126/67 164/84 176/77  Resp 18 9 14   SpO2 100 % 97 % 97 %  BSA (m2) 1.73 m2       Laboratory Data    Latest Ref Rng & Units 04/16/2024    9:47 AM  CBC EXTENDED  WBC 4.0 - 10.5 K/uL 4.1   RBC 3.87 - 5.11 MIL/uL 4.09   Hemoglobin 12.0 - 15.0 g/dL 87.5   HCT 63.9 - 53.9 % 40.1   Platelets 150 - 400 K/uL 281        Latest Ref Rng & Units 03/20/2024   12:55 PM  CMP  Glucose 70 - 99 mg/dL 86   BUN 8 - 23 mg/dL 13   Creatinine 9.55 - 1.00 mg/dL 9.05   Sodium 864 - 854 mmol/L 139   Potassium 3.5 - 5.1 mmol/L 4.1   Chloride 98 - 111 mmol/L 104    CO2 22 - 32 mmol/L 24   Calcium 8.9 - 10.3 mg/dL 9.0     Contraindications Contraindications were reviewed? Yes Contraindications to therapy were identified? No   Safety Precautions The following safety precautions were reviewed:  Fever: reviewed the importance of having a thermometer and the Centers for Disease Control and Prevention (CDC) definition of fever which is 100.54F (38C) or higher. Patient should call 24/7 triage at [phone] if experiencing a fever or any other symptoms Decreased white blood cells (WBCs) and increased risk for infection Decreased platelet count and increased risk of bleeding Decreased hemoglobin, part of the red blood cells that carry iron and oxygen Change in the color of urine Fatigue Hair loss Nausea or vomiting Mouth irritation or sores Doxorubicin (vesicant) Cardiotoxicity Hemorrhagic cystitis Secondary cancers Muscle or joint pain or weakness Peripheral neuropathy Hypersensitivity reactions Avoid grapefruit products Intimacy, sexual activity, contraception, fertility Handling body fluids and waste ***  Medication Reconciliation Current Outpatient Medications  Medication Sig Dispense Refill   acetaminophen  (TYLENOL ) 500 MG tablet  Take 500 mg by mouth every 6 (six) hours as needed for moderate pain (pain score 4-6).     Cholecalciferol 125 MCG (5000 UT) TABS Take 5,000 Units by mouth daily.     cyanocobalamin  2000 MCG tablet Take 2,000 mcg by mouth daily.     dexamethasone  (DECADRON ) 4 MG tablet Take 1 tablet day after chemo and 1 tablet 2 days after chemo with food 30 tablet 1   fluticasone (FLONASE) 50 MCG/ACT nasal spray Place 1 spray into both nostrils daily.     lidocaine -prilocaine  (EMLA ) cream Apply to affected area once 30 g 3   ondansetron  (ZOFRAN ) 8 MG tablet Take 1 tab (8 mg) by mouth every 8 hrs as needed for nausea/vomiting. Start third day after doxorubicin/cyclophosphamide chemotherapy. 30 tablet 1   oxyCODONE  (OXY  IR/ROXICODONE ) 5 MG immediate release tablet Take 1 tablet (5 mg total) by mouth every 6 (six) hours as needed for severe pain (pain score 7-10). 8 tablet 0   PHENobarbital  (LUMINAL) 97.2 MG tablet Take 97.2 mg by mouth at bedtime.     prochlorperazine  (COMPAZINE ) 10 MG tablet Take 1 tablet (10 mg total) by mouth every 6 (six) hours as needed for nausea or vomiting. 30 tablet 1   triamterene-hydrochlorothiazide (MAXZIDE-25) 37.5-25 MG tablet Take 1 tablet by mouth daily as needed (swelling).     No current facility-administered medications for this visit.   Medication reconciliation is based on the patient's most recent medication list in the electronic medical record (EMR) including herbal products and OTC medications.   The patient's medication list was reviewed today with the patient? Yes   Drug-drug interactions (DDIs) DDIs were evaluated? Yes Significant DDIs identified? No   Drug-Food Interactions Drug-food interactions were evaluated? Yes Drug-food interactions identified? Avoid grapefruit products  Follow-up Plan  Treatment start date: 05/20/24 Port placement date: 05/09/24 ECHO date: 05/07/24 We reviewed the prescriptions, premedications, and treatment regimen with the patient. Possible side effects of the treatment regimen were reviewed and management strategies were discussed.  Can use over-the-counter (OTC) options of loperamide (Imodium) as needed for diarrhea, loratadine (Claritin) as needed for G-CSF bone pain, and docusate + senna (Senna-S) as needed for constipation.  *** Clinical pharmacy will assist Dr. Vinay Gudena and Veva CHRISTELLA Ned on an as needed basis going forward  LORETTE PETERKIN participated in the discussion, expressed understanding, and voiced agreement with the above plan. All questions were answered to her satisfaction. The patient was advised to contact the clinic at [phone] with any questions or concerns prior to her return visit.   I spent 60  minutes assessing the patient.  Verley Pariseau A. Lucila, PharmD, BCOP, CPP  Norleen DELENA Lucila, RPH-CPP, 05/01/2024 9:02 AM  **Disclaimer: This note was dictated with voice recognition software. Similar sounding words can inadvertently be transcribed and this note may contain transcription errors which may not have been corrected upon publication of note.**     [1]  Allergies Allergen Reactions   Doxycycline Swelling    Facial swelling   Nsaids Other (See Comments)    History of bleeding ulcer in 2019   Chlorhexidine  Gluconate Rash   Sulfa Antibiotics Rash

## 2024-05-02 ENCOUNTER — Telehealth: Payer: Self-pay | Admitting: Hematology and Oncology

## 2024-05-02 ENCOUNTER — Ambulatory Visit (HOSPITAL_COMMUNITY)
Admission: RE | Admit: 2024-05-02 | Discharge: 2024-05-02 | Disposition: A | Discharge status: Home / Self Care | Source: Ambulatory Visit | Attending: Hematology and Oncology | Admitting: Hematology and Oncology

## 2024-05-02 DIAGNOSIS — C50411 Malignant neoplasm of upper-outer quadrant of right female breast: Secondary | ICD-10-CM | POA: Insufficient documentation

## 2024-05-02 DIAGNOSIS — Z170 Estrogen receptor positive status [ER+]: Secondary | ICD-10-CM | POA: Insufficient documentation

## 2024-05-02 MED ORDER — IOHEXOL 300 MG/ML  SOLN
100.0000 mL | Freq: Once | INTRAMUSCULAR | Status: AC | PRN
  Administered 2024-05-02: 100 mL via INTRAVENOUS

## 2024-05-02 NOTE — Pre-Procedure Instructions (Signed)
 Surgical Instructions   Your procedure is scheduled on May 09, 2024. Report to [facility] Main Entrance A at 8:00 A.M., then check in with the Admitting office. Any questions or running late day of surgery: call [phone]  Questions prior to your surgery date: call [phone], Monday-Friday, 8am-4pm. If you experience any cold or flu symptoms such as cough, fever, chills, shortness of breath, etc. between now and your scheduled surgery, please notify us  at the above number.     Remember:  Do not eat after midnight the night before your surgery   You may drink clear liquids until 7:00 AM the morning of your surgery.   Clear liquids allowed are: Water, Non-Citrus Juices (without pulp), Carbonated Beverages, Clear Tea (no milk, honey, etc.), Black Coffee Only (NO MILK, CREAM OR POWDERED CREAMER of any kind), and Gatorade.    Take these medicines the morning of surgery with A SIP OF WATER: fluticasone (FLONASE) nasal spray    May take these medicines IF NEEDED: acetaminophen  (TYLENOL )  oxyCODONE  (OXY IR/ROXICODONE )    One week prior to surgery, STOP taking any Aspirin (unless otherwise instructed by your surgeon) Aleve, Naproxen, Ibuprofen, Motrin, Advil, Goody's, BC's, all herbal medications, fish oil, and non-prescription vitamins.                     Do NOT Smoke (Tobacco/Vaping) for 24 hours prior to your procedure.  If you use a CPAP at night, you may bring your mask/headgear for your overnight stay.   You will be asked to remove any contacts, glasses, piercing's, hearing aid's, dentures/partials prior to surgery. Please bring cases for these items if needed.    Patients discharged the day of surgery will not be allowed to drive home, and someone needs to stay with them for 24 hours.  SURGICAL WAITING ROOM VISITATION Patients may have no more than 2 support people in the waiting area - these visitors may rotate.   Pre-op nurse will coordinate an appropriate time  for 1 ADULT support person, who may not rotate, to accompany patient in pre-op.  Children under the age of 45 must have an adult with them who is not the patient and must remain in the main waiting area with an adult.  If the patient needs to stay at the hospital during part of their recovery, the visitor guidelines for inpatient rooms apply.  Please refer to the [facility] website for the visitor guidelines for any additional information.   If you received a COVID test during your pre-op visit  it is requested that you wear a mask when out in public, stay away from anyone that may not be feeling well and notify your surgeon if you develop symptoms. If you have been in contact with anyone that has tested positive in the last 10 days please notify you surgeon.      Pre-operative CHG Bathing Instructions   You can play a key role in reducing the risk of infection after surgery. Your skin needs to be as free of germs as possible. You can reduce the number of germs on your skin by washing with CHG (chlorhexidine  gluconate) soap before surgery. CHG is an antiseptic soap that kills germs and continues to kill germs even after washing.   DO NOT use if you have an allergy to chlorhexidine /CHG or antibacterial soaps. If your skin becomes reddened or irritated, stop using the CHG and notify one of our RNs at [phone].  TAKE A SHOWER THE NIGHT BEFORE SURGERY   Please keep in mind the following:  DO NOT shave, including legs and underarms, 48 hours prior to surgery.   You may shave your face before/day of surgery.  Place clean sheets on your bed the night before surgery Use a clean washcloth (not used since being washed) for shower. DO NOT sleep with pet's night before surgery.  CHG Shower Instructions:  Wash your face and private area with normal soap. If you choose to wash your hair, wash first with your normal shampoo.  After you use shampoo/soap, rinse your hair and body  thoroughly to remove shampoo/soap residue.  Turn the water OFF and apply half the bottle of CHG soap to a CLEAN washcloth.  Apply CHG soap ONLY FROM YOUR NECK DOWN TO YOUR TOES (washing for 3-5 minutes)  DO NOT use CHG soap on face, private areas, open wounds, or sores.  Pay special attention to the area where your surgery is being performed.  If you are having back surgery, having someone wash your back for you may be helpful. Wait 2 minutes after CHG soap is applied, then you may rinse off the CHG soap.  Pat dry with a clean towel  Put on clean pajamas    Additional instructions for the day of surgery: If you choose, you may shower the morning of surgery with an antibacterial soap.  DO NOT APPLY any lotions, deodorants, cologne, or perfumes.   Do not wear jewelry or makeup Do not wear nail polish, gel polish, artificial nails, or any other type of covering on natural nails (fingers and toes) Do not bring valuables to the hospital. [facility] is not responsible for valuables/personal belongings. Put on clean/comfortable clothes.  Please brush your teeth.  Ask your nurse before applying any prescription medications to the skin.

## 2024-05-02 NOTE — Therapy (Signed)
 " OUTPATIENT PHYSICAL THERAPY BREAST CANCER POST OP FOLLOW UP   Patient Name: Melanie Reese MRN: [id number] DOB:[birth year], 68 y.o., female Today's Date: 05/05/2024  END OF SESSION:  PT End of Session - 05/05/24 1456     Visit Number 3    Number of Visits 11    Date for Recertification  06/02/24    PT Start Time 1105    PT Stop Time 1149    PT Time Calculation (min) 44 min    Activity Tolerance Patient tolerated treatment well    Behavior During Therapy WFL for tasks assessed/performed           Past Medical History:  Diagnosis Date   Anemia    Arthritis    Basal cell carcinoma    on face   Cancer (HCC)    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 Last seizure was 1983 per patient.   Past Surgical History:  Procedure Laterality Date   AXILLARY LYMPH NODE DISSECTION Right 04/16/2024   Procedure: REDGIE HARD;  Surgeon: Aron Shoulders, MD;  Location: [facility] OR;  Service: General;  Laterality: Right;  RIGHT AXILLARY LYMPH NODE DISSECTION   BRAIN SURGERY     1975, 1976 plate in head and burrow from head injury   BREAST BIOPSY Right 02/22/2024   US  RT BREAST BX W LOC DEV 1ST LESION IMG BX SPEC US  GUIDE 02/22/2024 [facility] MAMMOGRAPHY   BREAST LUMPECTOMY Right 03/25/2024   Procedure: BREAST LUMPECTOMY;  Surgeon: Aron Shoulders, MD;  Location: [facility];  Service: General;  Laterality: Right;   COLONOSCOPY     DRUG INDUCED ENDOSCOPY     SENTINEL NODE BIOPSY Right 03/25/2024   Procedure: BIOPSY, LYMPH NODE, SENTINEL;  Surgeon: Aron Shoulders, MD;  Location: [facility];  Service: General;  Laterality: Right;  GEN w/PEC BLOCK RIGHT BREAST  LUMPECTOMY RIGHT SENTINEL LYMPH NODE BIOPSY   Patient Active Problem List   Diagnosis Date Noted   Malignant neoplasm of upper-outer quadrant of right breast in female, estrogen receptor positive (HCC) 03/11/2024   Metatarsalgia of both feet 03/20/2017   Knee pain, bilateral 03/20/2017    PCP: Montie Pizza, MD   REFERRING PROVIDER: Shoulders Aron, MD   REFERRING DIAG: C50.411,Z17.0 (ICD-10-CM) - Malignant neoplasm of upper-outer quadrant of right breast in female, estrogen receptor positive (HCC)     THERAPY DIAG:  Abnormal posture  Malignant neoplasm of upper-outer quadrant of right breast in female, estrogen receptor positive (HCC)  Aftercare following surgery for neoplasm  At risk for lymphedema  Stiffness of right shoulder, not elsewhere classified  Rationale for Evaluation and Treatment: Rehabilitation  ONSET DATE: 02/25/2024   SUBJECTIVE:  SUBJECTIVE STATEMENT: I am getting better.  I do feel some numbness   PERTINENT HISTORY:  Patient was diagnosed on 02/25/2024 with right grade 2 invasive ductal carcinoma. It measures 2.6 cm and is located in the upper-outer quadrant. It is ER/PR +, HER2 - with a Ki67 of 30%.  She is s/p a right lumpectomy with SLNB on 03/25/2024 with 3+/4 LN's.  ALND on 04/16/2024 with 2/29 nodes positive.  Will be having chemotherapy.    PATIENT GOALS:  Reassess how my recovery is going related to arm function, pain, and swelling.  PAIN:  Are you having pain? No  PRECAUTIONS: Recent Surgery, right UE Lymphedema risk,   RED FLAGS: None   ACTIVITY LEVEL / LEISURE: most home activities except vaccuming   OBJECTIVE:   PATIENT SURVEYS:   QUICK DASH: 45% from 34% last visit  OBSERVATIONS: Bruising noted all around areola present just last 2 days; see photo  in media  POSTURE:  Forward head and rounded shoulders posture   LYMPHEDEMA ASSESSMENT:  UPPER EXTREMITY AROM/PROM:   A/PROM RIGHT   eval   RIGHT 04/15/2024 05/05/24  Shoulder extension 63 52 45  Shoulder flexion 152 153 108  Shoulder abduction 145 148 90 - cording noted  Shoulder internal rotation 54 67   Shoulder external rotation 75 102                           (Blank rows = not tested)   A/PROM LEFT   eval  Shoulder extension 52  Shoulder flexion 152  Shoulder abduction 156  Shoulder internal rotation 61  Shoulder external rotation 81                          (Blank rows = not tested)   CERVICAL AROM: All within normal limits:      Percent limited  Flexion WFL  Extension WFL  Right lateral flexion WFL  Left lateral flexion WFL  Right rotation WFL  Left rotation WFL      LYMPHEDEMA ASSESSMENTS (in cm):    LANDMARK RIGHT   eval RIGHT 04/15/2024 05/05/24  10 cm proximal to olecranon process from proximal aspect of olecranon 30.1 30.3 31.2  Olecranon process 24.1 24.1 25  10  cm proximal to ulnar styloid process from proximal aspect of styloid process 21.7 22.2 21.9  Just distal to ulnar styloid process 15.4 15.3 15.6  Across hand at thumb web space 18.6 18.3   At base of 2nd digit 6.5 6.2 6.5  (Blank rows = not tested)   LANDMARK LEFT   eval  10 cm proximal to olecranon process from proximal aspect of olecranon 30.9   Olecranon process 23.8  10 cm proximal to ulnar styloid process from proximal aspect of styloid process 20.7  Just distal to ulnar styloid process 15.6  Across hand at thumb web space 17.5  At base of 2nd digit 6.1  (Blank rows = not tested)  Surgery type/Date: 03/25/2024 Right lumpectomy with SLNB Number of lymph nodes removed: 3+/4 LN's, pending lymphadenectomy on 04/16/2024 Current/past treatment (chemo, radiation, hormone therapy):  Other symptoms:  Heaviness/tightness Yes Pain Yes Pitting edema No Infections No Decreased scar  mobility Yes Stemmer sign No  TODAYS TREATMENT 05/05/24 Lymphedivas size medium short pt will check online to see what she wants to order and has to self order due to no diagnosis of lymphedema.  Discussed post op instructions per below as  well as scar massage review.  Iscussed POC and cording     PATIENT EDUCATION:  Education details: discussed and demonstrated scar massage to areolar incision but not to axillary incision until healed from surgery tomorrow, discussed prophylactic sleeve that we will help her get, SOZO screen to set up after next visit, gave video information if she wants to watch today, reviewed HEP and discussed importance taking to tightness only.. Advised to perform in a week after surgery or if she has drains, after they are removed. Scheduled follow up in 3 weeks Person educated: Patient Education method: Explanation, Demonstration, and Handouts Education comprehension: verbalized understanding  HOME EXERCISE PROGRAM: Reviewed previously given post op HEP.   ASSESSMENT:  CLINICAL IMPRESSION: Pt is s/p Right lumpectomy with SLNB on 03/25/2024 with 3+/4 LN's and ALND on 04/16/24 with 29 more nodes removed. Due to surgery it was recommended that pt get a compression sleeve to wear during chemo and radiation.  She will search the lymphedivas site to see what she would want to order and given the size.  Due to cording and decreased ROM we will initiate more therapy at this time.   Pt will benefit from skilled therapeutic intervention to improve on the following deficits: Decreased knowledge of precautions, impaired UE functional use, pain, decreased ROM, postural dysfunction.   PT treatment/interventions: ADL/Self care home management, [phone]- PT Re-evaluation, 97110-Therapeutic exercises, 97530- Therapeutic activity, [id number]- Neuromuscular re-education, 97535- Self Care, [id number]- Manual therapy, and Patient/Family education   GOALS: Goals reviewed with patient?  Yes  GOALS MET AT EVAL:  GOALS Name Target Date Goal status  1 Pt will be able to verbalize understanding of pertinent lymphedema risk reduction practices relevant to her dx specifically related to skin care.  Baseline:  No knowledge Eval Achieved at eval  2 Pt will be able to return demo and/or verbalize understanding of the post op HEP related to regaining shoulder ROM. Baseline:  No knowledge Eval Achieved at eval  3 Pt will be able to verbalize understanding of the importance of viewing the post op After Breast CA Class video for further lymphedema risk reduction education and therapeutic exercise.  Baseline:  No knowledge Eval Achieved at eval   LONG TERM GOALS:  (STG=LTG)  GOALS Name Target Date  Goal status  1 Pt will demonstrate she has regained full shoulder ROM and function post operatively compared to baselines.  Baseline: After next surgery on 04/16/2024 06/02/2024 INITIAL  2 Pt will be fit for compression sleeve for prophylactic reasons 06/02/24 MET  3 Pt will have understanding of lymphedema precautions and will have all questions answered 06/02/24 ONGOING  4   INITIAL     PLAN:  PT FREQUENCY/DURATION: 2x per week x 4 weeks. Including therapeutic exercise, manual therapy, self care, therapeutic activities and re-eval.  Orthotic fit training as well.   PLAN FOR NEXT SESSION: set up SOZO screens, Rt AAROM/PROM and cording (gentle to start),    [facility]  [address], [address]  [place] [address]  [phone]  After Breast Cancer Class Video It is recommended you view the ABC class video to be educated on lymphedema risk reduction. This video lasts for about 30 minutes. It can be viewed on our website here: [email or web address]  Scar massage You can begin gentle scar massage to you incision sites. Gently place one hand on the incision and move the skin (without sliding on the  skin) in various directions. Do this for a few minutes and  then you can gently massage either coconut oil or vitamin E cream into the scars.  Compression garment You should continue wearing your compression bra until you feel like you no longer have swelling.  Home exercise Program Continue doing the exercises you were given until you feel like you can do them without feeling any tightness at the end.   Walking Program Studies show that 30 minutes of walking per day (fast enough to elevate your heart rate) can significantly reduce the risk of a cancer recurrence. If you can't walk due to other medical reasons, we encourage you to find another activity you could do (like a stationary bike or water exercise).  Posture After breast cancer surgery, people frequently sit with rounded shoulders posture because it puts their incisions on slack and feels better. If you sit like this and scar tissue forms in that position, you can become very tight and have pain sitting or standing with good posture. Try to be aware of your posture and sit and stand up tall to heal properly.  Follow up PT: It is recommended you return every 3 months for the first 3 years following surgery to be assessed on the SOZO machine for an L-Dex score. This helps prevent clinically significant lymphedema in 95% of patients. These follow up screens are 10 minute appointments that you are not billed for.  Larue Saddie SAUNDERS, PT 05/05/2024, 2:58 PM  "

## 2024-05-05 ENCOUNTER — Ambulatory Visit: Payer: Self-pay | Admitting: Rehabilitation

## 2024-05-05 ENCOUNTER — Encounter: Payer: Self-pay | Admitting: Rehabilitation

## 2024-05-05 ENCOUNTER — Encounter (HOSPITAL_COMMUNITY)
Admission: RE | Admit: 2024-05-05 | Discharge: 2024-05-05 | Disposition: A | Discharge status: Home / Self Care | Source: Ambulatory Visit | Attending: General Surgery | Admitting: General Surgery

## 2024-05-05 ENCOUNTER — Encounter (HOSPITAL_COMMUNITY): Payer: Self-pay

## 2024-05-05 ENCOUNTER — Other Ambulatory Visit: Payer: Self-pay

## 2024-05-05 VITALS — BP 140/85 | HR 74 | Temp 97.7°F | Resp 16 | Ht 60.0 in | Wt 151.9 lb

## 2024-05-05 DIAGNOSIS — Z170 Estrogen receptor positive status [ER+]: Secondary | ICD-10-CM

## 2024-05-05 DIAGNOSIS — D500 Iron deficiency anemia secondary to blood loss (chronic): Secondary | ICD-10-CM | POA: Diagnosis not present

## 2024-05-05 DIAGNOSIS — Z85828 Personal history of other malignant neoplasm of skin: Secondary | ICD-10-CM | POA: Insufficient documentation

## 2024-05-05 DIAGNOSIS — Z01818 Encounter for other preprocedural examination: Secondary | ICD-10-CM | POA: Diagnosis present

## 2024-05-05 DIAGNOSIS — Z01812 Encounter for preprocedural laboratory examination: Secondary | ICD-10-CM | POA: Insufficient documentation

## 2024-05-05 DIAGNOSIS — K219 Gastro-esophageal reflux disease without esophagitis: Secondary | ICD-10-CM | POA: Insufficient documentation

## 2024-05-05 DIAGNOSIS — Z9189 Other specified personal risk factors, not elsewhere classified: Secondary | ICD-10-CM

## 2024-05-05 DIAGNOSIS — R293 Abnormal posture: Secondary | ICD-10-CM

## 2024-05-05 DIAGNOSIS — Z483 Aftercare following surgery for neoplasm: Secondary | ICD-10-CM

## 2024-05-05 DIAGNOSIS — C50911 Malignant neoplasm of unspecified site of right female breast: Secondary | ICD-10-CM | POA: Diagnosis not present

## 2024-05-05 DIAGNOSIS — Z9889 Other specified postprocedural states: Secondary | ICD-10-CM | POA: Insufficient documentation

## 2024-05-05 DIAGNOSIS — M25611 Stiffness of right shoulder, not elsewhere classified: Secondary | ICD-10-CM

## 2024-05-05 HISTORY — DX: Headache, unspecified: R51.9

## 2024-05-05 HISTORY — DX: Basal cell carcinoma of skin, unspecified: C44.91

## 2024-05-05 HISTORY — DX: Gastro-esophageal reflux disease without esophagitis: K21.9

## 2024-05-05 HISTORY — DX: Anemia, unspecified: D64.9

## 2024-05-05 LAB — BASIC METABOLIC PANEL WITH GFR
Anion gap: 8 (ref 5–15)
BUN: 12 mg/dL (ref 8–23)
CO2: 27 mmol/L (ref 22–32)
Calcium: 9.2 mg/dL (ref 8.9–10.3)
Chloride: 103 mmol/L (ref 98–111)
Creatinine, Ser: 0.83 mg/dL (ref 0.44–1.00)
GFR, Estimated: >60 mL/min
Glucose, Bld: 105 mg/dL — ABNORMAL HIGH (ref 70–99)
Potassium: 4.3 mmol/L (ref 3.5–5.1)
Sodium: 139 mmol/L (ref 135–145)

## 2024-05-05 NOTE — Progress Notes (Signed)
 PCP - Dr Montie Pizza Cardiologist - none  CT Chest x-ray - 05/02/24 EKG - 04/16/24 Stress Test - n/a ECHO -  Cardiac Cath - n/a  ICD Pacemaker/Loop - n/a  Sleep Study -  n/a  Diabetes - n/a  Aspirin & Blood Thinner Instructions:  n/a  ERAS - clear liquids til 7 AM DOS.  Anesthesia review: Yes  STOP now taking any Aspirin (unless otherwise instructed by your surgeon), Aleve, Naproxen, Ibuprofen, Motrin, Advil, Goody's, BC's, all herbal medications, fish oil, and all vitamins.   Coronavirus Screening Do you have any of the following symptoms:  Cough yes/no: No Fever (>100.43F)  yes/no: No Runny nose yes/no: No Sore throat yes/no: No Difficulty breathing/shortness of breath  yes/no: No  Have you traveled in the last 14 days and where? yes/no: No  Patient verbalized understanding of instructions that were given to them at the PAT appointment. Patient was also instructed that they will need to review over the PAT instructions again at home before surgery.

## 2024-05-06 ENCOUNTER — Inpatient Hospital Stay: Admitting: Pharmacist

## 2024-05-06 ENCOUNTER — Inpatient Hospital Stay

## 2024-05-06 DIAGNOSIS — Z170 Estrogen receptor positive status [ER+]: Secondary | ICD-10-CM

## 2024-05-06 DIAGNOSIS — C50411 Malignant neoplasm of upper-outer quadrant of right female breast: Secondary | ICD-10-CM | POA: Diagnosis not present

## 2024-05-06 NOTE — Progress Notes (Signed)
 Anesthesia Chart Review:  Case: [id number] Date/Time: 05/09/24 0945   Procedure: INSERTION, TUNNELED CENTRAL VENOUS DEVICE, WITH PORT - PORT PLACEMENT WITH ULTRASOUND GUIDANCE   Anesthesia type: General   Pre-op diagnosis: RIGHT BREAST CANCER   [facility] / [facility]   Surgeons: Aron Shoulders, MD       DISCUSSION: Patient is a 68 year old female scheduled for the above procedure.  History includes smoker, right breast cancer (s/p right breast lumpectomy and sentinel LN biopsy 03/25/2024), anemia (GI bleed, s/p PRBC, +ulcer EGD 12/2017), GERD, skin cancer (BCC), SDH (s/p brain surgery), seizure (1983).   Anesthesia team to evaluate on the day of surgery. Restricted RUE.   VS: BP (!) 140/85   Pulse 74   Temp 36.5 C   Resp 16   Ht 5' (1.524 m)   Wt 68.9 kg   SpO2 100%   BMI 29.67 kg/m   PROVIDERS: Teresa Channel, MD is PCP  Odean Potts, MD is HEM-ONC Maritza Stagger, MD is RAD-ONC  LABS: Labs results from 04/16/2024 and 05/05/2024 include: Lab Results  Component Value Date   WBC 4.1 04/16/2024   HGB 12.4 04/16/2024   HCT 40.1 04/16/2024   PLT 281 04/16/2024   GLUCOSE 105 (H) 05/05/2024   NA 139 05/05/2024   K 4.3 05/05/2024   CL 103 05/05/2024   CREATININE 0.83 05/05/2024   BUN 12 05/05/2024   CO2 27 05/05/2024     IMAGES: CT Chest/abd/pelvis 05/02/2024: IMPRESSION: 1. Postoperative findings of right lumpectomy and axillary lymph node dissection with a large fluid attenuation seroma in the upper-outer right breast measuring 8.3 x 3.9 cm. 2. No evidence of lymphadenopathy or metastatic disease in the chest, abdomen, or pelvis. 3. Subsolid nodule in the dependent right lower lobe measuring 0.4 cm, nonspecific and most likely infectious or inflammatory. Attention on follow-up. 4. Multiple benign cysts scattered throughout the liver as well as additional small subcentimeter low-attenuation lesions too small to confidently characterize. These are probably  additional cysts and/or hemangiomata, but could be more confidently characterized by MRI if desired. Alternately, attention on follow-up. 5. Hepatic steatosis. - Aortic Atherosclerosis (ICD10-I70   EKG: 04/16/2024: NSR   CV: N/A  Past Medical History:  Diagnosis Date   Anemia    Arthritis    Basal cell carcinoma    on face   Cancer [facility])    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 Last seizure was 1983 per patient.    Past Surgical History:  Procedure Laterality Date   AXILLARY LYMPH NODE DISSECTION Right 04/16/2024   Procedure: LYMPHADENECTOMY, AXILLARY;  Surgeon: Aron Shoulders, MD;  Location: [facility];  Service: General;  Laterality: Right;  RIGHT AXILLARY LYMPH NODE DISSECTION   BRAIN SURGERY     1975, 1976 plate in head and burrow from head injury   BREAST BIOPSY Right 02/22/2024   US  RT BREAST BX W LOC DEV 1ST LESION IMG BX SPEC US  GUIDE 02/22/2024 [facility] MAMMOGRAPHY   BREAST LUMPECTOMY Right 03/25/2024   Procedure: BREAST LUMPECTOMY;  Surgeon: Aron Shoulders, MD;  Location:  SURGERY CENTER;  Service: General;  Laterality: Right;   COLONOSCOPY  DRUG INDUCED ENDOSCOPY     SENTINEL NODE BIOPSY Right 03/25/2024   Procedure: BIOPSY, LYMPH NODE, SENTINEL;  Surgeon: Aron Shoulders, MD;  Location: [facility];  Service: General;  Laterality: Right;  GEN w/PEC BLOCK RIGHT BREAST LUMPECTOMY RIGHT SENTINEL LYMPH NODE BIOPSY    MEDICATIONS:  acetaminophen  (TYLENOL ) 500 MG tablet   Cholecalciferol 125 MCG (5000 UT) TABS   cyanocobalamin  2000 MCG tablet   dexamethasone  (DECADRON ) 4 MG tablet   fluticasone (FLONASE) 50 MCG/ACT nasal spray   lidocaine -prilocaine   (EMLA ) cream   ondansetron  (ZOFRAN ) 8 MG tablet   oxyCODONE  (OXY IR/ROXICODONE ) 5 MG immediate release tablet   PHENobarbital  (LUMINAL) 97.2 MG tablet   prochlorperazine  (COMPAZINE ) 10 MG tablet   triamterene-hydrochlorothiazide (MAXZIDE-25) 37.5-25 MG tablet   No current facility-administered medications for this encounter.   Not currently taking: Cyanocobalamin , cholecalciferol, Decadron , EMLA  cream, Zofran , Oxy IR, Compazine , Maxzide.  Melanie Ruder, PA-C Surgical Short Stay/Anesthesiology [facility] Phone [phone] [facility] Phone [phone] 05/06/2024 3:49 PM

## 2024-05-06 NOTE — Anesthesia Preprocedure Evaluation (Addendum)
"                                    Anesthesia Evaluation  Patient identified by MRN, date of birth, ID band Patient awake    Reviewed: Allergy & Precautions, NPO status , Patient's Chart, lab work & pertinent test results  History of Anesthesia Complications Negative for: history of anesthetic complications  Airway Mallampati: II  TM Distance: >3 FB Neck ROM: Limited  Mouth opening: Limited Mouth Opening  Dental  (+) Dental Advisory Given, Teeth Intact   Pulmonary neg pulmonary ROS, neg recent URI   Pulmonary exam normal breath sounds clear to auscultation       Cardiovascular negative cardio ROS Normal cardiovascular exam Rhythm:Regular Rate:Normal  Echo 04/2024  1. Left ventricular ejection fraction, by estimation, is 55 to 60%. The  left ventricle has normal function. The left ventricle has no regional  wall motion abnormalities. Left ventricular diastolic parameters were  normal. The average left ventricular global longitudinal strain is -20.4 %.  The global longitudinal strain is normal.   2. Right ventricular systolic function is normal. The right ventricular size is  normal. Tricuspid regurgitation signal is inadequate for assessing PA pressure.   3. The mitral valve is normal in structure. Trivial mitral valve regurgitation.  No evidence of mitral stenosis.   4. The aortic valve is tricuspid. Aortic valve regurgitation is not visualized.  No aortic stenosis is present.   5. The inferior vena cava is normal in size with greater than 50%  respiratory variability, suggesting right atrial pressure of 3 mmHg.      Neuro/Psych  Headaches, Seizures -, Well Controlled,   Neuromuscular disease  negative psych ROS   GI/Hepatic Neg liver ROS,GERD  Controlled,,  Endo/Other   Obesity   Renal/GU negative Renal ROS     Musculoskeletal  (+) Arthritis ,    Abdominal   Peds  Hematology  (+) Blood dyscrasia, anemia   Anesthesia Other Findings    Reproductive/Obstetrics  Breast cancer                               Anesthesia Physical Anesthesia Plan  ASA: 2  Anesthesia Plan: General   Post-op Pain Management: Tylenol  PO (pre-op)* and Regional block*   Induction: Intravenous  PONV Risk Score and Plan: 3 and Treatment may vary due to age or medical condition, Ondansetron  and Dexamethasone   Airway Management Planned: LMA  Additional Equipment: None  Intra-op Plan:   Post-operative Plan: Extubation in OR  Informed Consent: I have reviewed the patients History and Physical, chart, labs and discussed the procedure including the risks, benefits and alternatives for the proposed anesthesia with the patient or authorized representative who has indicated his/her understanding and acceptance.     Dental advisory given  Plan Discussed with: CRNA  Anesthesia Plan Comments: (PAT note written 05/06/2024 by Isaiah Ruder, PA-C.  )         Anesthesia Quick Evaluation  "

## 2024-05-06 NOTE — Progress Notes (Signed)
 " [facility]        Telephone: [phone]?Fax: [phone]   Oncology Clinical Pharmacist Practitioner Initial Assessment   Melanie Reese is a 68 y.o. female with a diagnosis of breast cancer. They were contacted today via in-person visit.  Indication/Regimen Doxorubicin (Adriamycin) and cyclophosphamide (Cytoxan) followed by paclitaxel (Taxol) are being used appropriately for treatment of breast cancer by Dr. Vinay Gudena.      Wt Readings from Last 1 Encounters:  05/05/24 151 lb 14.4 oz (68.9 kg)    Estimated body surface area is 1.71 meters squared as calculated from the following:   Height as of 05/05/24: 5' (1.524 m).   Weight as of 05/05/24: 151 lb 14.4 oz (68.9 kg).  The dosing regimen is every 14 days for 4 cycles  Doxorubicin (60 mg/m2) on Day 1 Cyclophosphamide (600 mg/m2) on Day 1 Pegfilgrastim (6 mg) on Day 3  Followed by a dosing regimen that is every 7 days for 12 cycles  Paclitaxel (80 mg/m2) on Day 1  It is planned to continue until treatment plan completion or unacceptable toxicity. The tentative start date is: 05/20/24  Dose Modifications None   Allergies Allergies[1]  Vitals    05/05/2024    2:10 PM 04/29/2024    2:54 PM 04/16/2024    3:15 PM  Oncology Vitals  Height 152 cm    Weight 68.901 kg 70.897 kg   Weight (lbs) 151 lbs 14 oz 156 lbs 5 oz   BMI 29.67 kg/m2 30.53 kg/m2   Temp 97.7 F (36.5 C) 97.7 F (36.5 C)   Pulse Rate 74 75 65  BP 140/85 126/67 164/84  Resp 16 18 9   SpO2 100 % 100 % 97 %  BSA (m2) 1.71 m2 1.73 m2      Laboratory Data    Latest Ref Rng & Units 04/16/2024    9:47 AM  CBC EXTENDED  WBC 4.0 - 10.5 K/uL 4.1   RBC 3.87 - 5.11 MIL/uL 4.09   Hemoglobin 12.0 - 15.0 g/dL 87.5   HCT 63.9 - 53.9 % 40.1   Platelets 150 - 400 K/uL 281        Latest Ref Rng & Units 05/05/2024    3:00 PM 03/20/2024   12:55 PM  CMP  Glucose 70 - 99 mg/dL 894  86   BUN 8 - 23 mg/dL 12  13   Creatinine 9.55 -  1.00 mg/dL 9.16  9.05   Sodium 864 - 145 mmol/L 139  139   Potassium 3.5 - 5.1 mmol/L 4.3  4.1   Chloride 98 - 111 mmol/L 103  104   CO2 22 - 32 mmol/L 27  24   Calcium 8.9 - 10.3 mg/dL 9.2  9.0     Contraindications Contraindications were reviewed? Yes Contraindications to therapy were identified? No   Safety Precautions The following safety precautions were reviewed:  Fever: reviewed the importance of having a thermometer and the Centers for Disease Control and Prevention (CDC) definition of fever which is 100.68F (38C) or higher. Patient should call 24/7 triage at [phone] if experiencing a fever or any other symptoms Decreased white blood cells (WBCs) and increased risk for infection Decreased platelet count and increased risk of bleeding Decreased hemoglobin, part of the red blood cells that carry iron and oxygen Change in the color of urine Fatigue Hair loss Nausea or vomiting Mouth irritation or sores Doxorubicin (vesicant) Cardiotoxicity Hemorrhagic cystitis Secondary cancers Muscle or joint pain  or weakness Peripheral neuropathy Hypersensitivity reactions Avoid grapefruit products Intimacy, sexual activity, contraception, fertility Handling body fluids and waste  Medication Reconciliation Current Outpatient Medications  Medication Sig Dispense Refill   acetaminophen  (TYLENOL ) 500 MG tablet Take 500 mg by mouth every 6 (six) hours as needed for moderate pain (pain score 4-6).     fluticasone (FLONASE) 50 MCG/ACT nasal spray Place 1 spray into both nostrils daily.     oxyCODONE  (OXY IR/ROXICODONE ) 5 MG immediate release tablet Take 1 tablet (5 mg total) by mouth every 6 (six) hours as needed for severe pain (pain score 7-10). 8 tablet 0   PHENobarbital  (LUMINAL) 97.2 MG tablet Take 97.2 mg by mouth at bedtime.     Cholecalciferol 125 MCG (5000 UT) TABS Take 5,000 Units by mouth daily. (Patient not taking: Reported on 05/06/2024)     cyanocobalamin  2000 MCG  tablet Take 2,000 mcg by mouth daily. (Patient not taking: Reported on 05/06/2024)     dexamethasone  (DECADRON ) 4 MG tablet Take 1 tablet day after chemo and 1 tablet 2 days after chemo with food (Patient not taking: Reported on 05/06/2024) 30 tablet 1   lidocaine -prilocaine  (EMLA ) cream Apply to affected area once (Patient not taking: Reported on 05/06/2024) 30 g 3   ondansetron  (ZOFRAN ) 8 MG tablet Take 1 tab (8 mg) by mouth every 8 hrs as needed for nausea/vomiting. Start third day after doxorubicin/cyclophosphamide chemotherapy. (Patient not taking: Reported on 05/06/2024) 30 tablet 1   prochlorperazine  (COMPAZINE ) 10 MG tablet Take 1 tablet (10 mg total) by mouth every 6 (six) hours as needed for nausea or vomiting. (Patient not taking: Reported on 05/06/2024) 30 tablet 1   triamterene-hydrochlorothiazide (MAXZIDE-25) 37.5-25 MG tablet Take 1 tablet by mouth daily as needed (swelling). (Patient not taking: Reported on 05/06/2024)     No current facility-administered medications for this visit.    Medication reconciliation is based on the patient's most recent medication list in the electronic medical record (EMR) including herbal products and OTC medications.   The patient's medication list was reviewed today with the patient? Yes   Drug-drug interactions (DDIs) DDIs were evaluated? Yes Significant DDIs identified? No   Drug-Food Interactions Drug-food interactions were evaluated? Yes Drug-food interactions identified? Avoid grapefruit products  Follow-up Plan  Treatment start date: 05/20/24 Port placement date: 05/09/24 ECHO date: 05/07/24 We reviewed the prescriptions, premedications, and treatment regimen with the patient. Possible side effects of the treatment regimen were reviewed and management strategies were discussed.  Can use over-the-counter (OTC) options of loperamide (Imodium) as needed for diarrhea, loratadine (Claritin) as needed for G-CSF bone pain, and docusate + senna  (Senna-S) as needed for constipation.  Clinical pharmacy will assist Dr. Vinay Gudena and Veva CHRISTELLA Ned on an as needed basis going forward  REBBIE LAURICELLA participated in the discussion, expressed understanding, and voiced agreement with the above plan. All questions were answered to her satisfaction. The patient was advised to contact the clinic at [phone] with any questions or concerns prior to her return visit.   I spent 60 minutes assessing the patient.  Kharisma Glasner A. Lucila, PharmD, BCOP, CPP  Norleen DELENA Lucila, RPH-CPP, 05/06/2024 3:10 PM  **Disclaimer: This note was dictated with voice recognition software. Similar sounding words can inadvertently be transcribed and this note may contain transcription errors which may not have been corrected upon publication of note.**      [1]  Allergies Allergen Reactions   Doxycycline Swelling    Facial swelling   Nsaids  Other (See Comments)    History of bleeding ulcer in 2019   Chlorhexidine  Gluconate Rash   Sulfa Antibiotics Rash   "

## 2024-05-07 ENCOUNTER — Telehealth: Payer: Self-pay | Admitting: *Deleted

## 2024-05-07 ENCOUNTER — Ambulatory Visit (HOSPITAL_COMMUNITY)
Admission: RE | Admit: 2024-05-07 | Discharge: 2024-05-07 | Disposition: A | Discharge status: Home / Self Care | Source: Ambulatory Visit | Attending: Hematology and Oncology | Admitting: Hematology and Oncology

## 2024-05-07 DIAGNOSIS — Z01810 Encounter for preprocedural cardiovascular examination: Secondary | ICD-10-CM | POA: Insufficient documentation

## 2024-05-07 DIAGNOSIS — Z170 Estrogen receptor positive status [ER+]: Secondary | ICD-10-CM | POA: Diagnosis not present

## 2024-05-07 DIAGNOSIS — Z0189 Encounter for other specified special examinations: Secondary | ICD-10-CM

## 2024-05-07 DIAGNOSIS — C50411 Malignant neoplasm of upper-outer quadrant of right female breast: Secondary | ICD-10-CM | POA: Insufficient documentation

## 2024-05-07 NOTE — Telephone Encounter (Signed)
 [id number], ICE COMPRESS: RANDOMIZED TRIAL OF LIMB CRYOCOMPRESSION VERSUS CONTINUOUS COMPRESSION VERSUS LOW CYCLIC COMPRESSION FOR THE PREVENTION  OF TAXANE-INDUCED PERIPHERAL NEUROPATHY   Spoke with pt over the phone today regarding interest in the S2205 clinical trial. Pt stated she is interested in the trial but is really nervous about her up coming chemo treatment. Pt asked that we call her following her 1st chemo treatment to schedule a time to discuss the trial and give the consent form. She stated she wants to get through the 1st chemo without having to think about anything else right now. We will follow up with pt after her 1st chemo treatment and possibly meet her on 06/04/23 during her 2nd chemo treatment to discuss the above study. Pt is in agreement.   Melanie Larsen, RN, BSN Clinical Research Nurse [phone] 05/07/2024

## 2024-05-08 LAB — ECHOCARDIOGRAM COMPLETE
Area-P 1/2: 3.72 cm2
S' Lateral: 2.6 cm

## 2024-05-09 ENCOUNTER — Encounter (HOSPITAL_COMMUNITY)
Admission: RE | Disposition: A | Discharge status: Home / Self Care | Payer: Self-pay | Source: Home / Self Care | Attending: General Surgery

## 2024-05-09 ENCOUNTER — Ambulatory Visit (HOSPITAL_COMMUNITY)
Admission: RE | Admit: 2024-05-09 | Discharge: 2024-05-09 | Disposition: A | Discharge status: Home / Self Care | Attending: General Surgery | Admitting: General Surgery

## 2024-05-09 ENCOUNTER — Ambulatory Visit (HOSPITAL_COMMUNITY): Payer: Self-pay | Admitting: Vascular Surgery

## 2024-05-09 ENCOUNTER — Encounter (HOSPITAL_COMMUNITY): Payer: Self-pay | Admitting: General Surgery

## 2024-05-09 ENCOUNTER — Ambulatory Visit (HOSPITAL_COMMUNITY)

## 2024-05-09 ENCOUNTER — Other Ambulatory Visit: Payer: Self-pay

## 2024-05-09 ENCOUNTER — Ambulatory Visit (HOSPITAL_COMMUNITY): Admitting: Anesthesiology

## 2024-05-09 DIAGNOSIS — K219 Gastro-esophageal reflux disease without esophagitis: Secondary | ICD-10-CM | POA: Insufficient documentation

## 2024-05-09 DIAGNOSIS — C50911 Malignant neoplasm of unspecified site of right female breast: Secondary | ICD-10-CM | POA: Diagnosis not present

## 2024-05-09 DIAGNOSIS — C50411 Malignant neoplasm of upper-outer quadrant of right female breast: Secondary | ICD-10-CM | POA: Insufficient documentation

## 2024-05-09 HISTORY — PX: PORTACATH PLACEMENT: SHX2246

## 2024-05-09 SURGERY — INSERTION, TUNNELED CENTRAL VENOUS DEVICE, WITH PORT
Anesthesia: General | Site: Chest

## 2024-05-09 MED ORDER — PROPOFOL 500 MG/50ML IV EMUL
INTRAVENOUS | Status: DC | PRN
  Administered 2024-05-09: 155 ug/kg/min via INTRAVENOUS

## 2024-05-09 MED ORDER — OXYCODONE HCL 5 MG PO TABS: 5.0000 mg | ORAL_TABLET | Freq: Four times a day (QID) | ORAL | 0 refills | Status: AC | PRN

## 2024-05-09 MED ORDER — DEXAMETHASONE SOD PHOSPHATE PF 10 MG/ML IJ SOLN
INTRAMUSCULAR | Status: DC | PRN
  Administered 2024-05-09: 10 mg via INTRAVENOUS

## 2024-05-09 MED ORDER — DROPERIDOL 2.5 MG/ML IJ SOLN: 0.6250 mg | Freq: Once | INTRAMUSCULAR | Status: DC | PRN

## 2024-05-09 MED ORDER — ONDANSETRON HCL 4 MG/2ML IJ SOLN
INTRAMUSCULAR | Status: DC | PRN
  Administered 2024-05-09: 4 mg via INTRAVENOUS

## 2024-05-09 MED ORDER — LIDOCAINE 2% (20 MG/ML) 5 ML SYRINGE
INTRAMUSCULAR | Status: DC | PRN
  Administered 2024-05-09: 60 mg via INTRAVENOUS

## 2024-05-09 MED ORDER — HEPARIN SOD (PORK) LOCK FLUSH 100 UNIT/ML IV SOLN
INTRAVENOUS | Status: AC
  Filled 2024-05-09: qty 5

## 2024-05-09 MED ORDER — FENTANYL CITRATE (PF) 100 MCG/2ML IJ SOLN
INTRAMUSCULAR | Status: AC
  Filled 2024-05-09: qty 2

## 2024-05-09 MED ORDER — PHENYLEPHRINE HCL-NACL 20-0.9 MG/250ML-% IV SOLN
INTRAVENOUS | Status: DC | PRN
  Administered 2024-05-09: 15 ug/min via INTRAVENOUS

## 2024-05-09 MED ORDER — MIDAZOLAM HCL 2 MG/2ML IJ SOLN
INTRAMUSCULAR | Status: AC
  Filled 2024-05-09: qty 2

## 2024-05-09 MED ORDER — LACTATED RINGERS IV SOLN: INTRAVENOUS | Status: DC

## 2024-05-09 MED ORDER — OXYCODONE HCL 5 MG/5ML PO SOLN: 5.0000 mg | Freq: Once | ORAL | Status: DC | PRN

## 2024-05-09 MED ORDER — CHLORHEXIDINE GLUCONATE 0.12 % MT SOLN
15.0000 mL | Freq: Once | OROMUCOSAL | Status: AC
  Administered 2024-05-09: 15 mL via OROMUCOSAL
  Filled 2024-05-09: qty 15

## 2024-05-09 MED ORDER — HEPARIN 6000 UNIT IRRIGATION SOLUTION
Status: DC | PRN
  Administered 2024-05-09: 1

## 2024-05-09 MED ORDER — LIDOCAINE HCL 1 % IJ SOLN
INTRAMUSCULAR | Status: DC | PRN
  Administered 2024-05-09: 8 mL via INTRAMUSCULAR

## 2024-05-09 MED ORDER — HEPARIN 6000 UNIT IRRIGATION SOLUTION
Status: AC
  Filled 2024-05-09: qty 500

## 2024-05-09 MED ORDER — BUPIVACAINE-EPINEPHRINE (PF) 0.25% -1:200000 IJ SOLN
INTRAMUSCULAR | Status: AC
  Filled 2024-05-09: qty 30

## 2024-05-09 MED ORDER — OXYCODONE HCL 5 MG PO TABS: 5.0000 mg | ORAL_TABLET | Freq: Once | ORAL | Status: DC | PRN

## 2024-05-09 MED ORDER — GLYCOPYRROLATE PF 0.2 MG/ML IJ SOSY
PREFILLED_SYRINGE | INTRAMUSCULAR | Status: DC | PRN
  Administered 2024-05-09 (×2): .1 mg via INTRAVENOUS

## 2024-05-09 MED ORDER — LIDOCAINE HCL (PF) 1 % IJ SOLN
INTRAMUSCULAR | Status: AC
  Filled 2024-05-09: qty 30

## 2024-05-09 MED ORDER — PROPOFOL 10 MG/ML IV BOLUS
INTRAVENOUS | Status: DC | PRN
  Administered 2024-05-09: 100 mg via INTRAVENOUS
  Administered 2024-05-09: 30 mg via INTRAVENOUS

## 2024-05-09 MED ORDER — ACETAMINOPHEN 500 MG PO TABS
1000.0000 mg | ORAL_TABLET | ORAL | Status: AC
  Administered 2024-05-09: 1000 mg via ORAL
  Filled 2024-05-09: qty 2

## 2024-05-09 MED ORDER — PROPOFOL 1000 MG/100ML IV EMUL
INTRAVENOUS | Status: AC
  Filled 2024-05-09: qty 100

## 2024-05-09 MED ORDER — HEPARIN SOD (PORK) LOCK FLUSH 100 UNIT/ML IV SOLN
INTRAVENOUS | Status: DC | PRN
  Administered 2024-05-09: 500 [IU] via INTRAVENOUS

## 2024-05-09 MED ORDER — FENTANYL CITRATE (PF) 250 MCG/5ML IJ SOLN
INTRAMUSCULAR | Status: DC | PRN
  Administered 2024-05-09 (×2): 50 ug via INTRAVENOUS

## 2024-05-09 MED ORDER — ORAL CARE MOUTH RINSE: 15.0000 mL | Freq: Once | OROMUCOSAL | Status: AC

## 2024-05-09 MED ORDER — CEFAZOLIN SODIUM-DEXTROSE 2-4 GM/100ML-% IV SOLN
2.0000 g | INTRAVENOUS | Status: AC
  Administered 2024-05-09: 2 g via INTRAVENOUS
  Filled 2024-05-09: qty 100

## 2024-05-09 MED ORDER — FENTANYL CITRATE (PF) 100 MCG/2ML IJ SOLN: 25.0000 ug | INTRAMUSCULAR | Status: DC | PRN

## 2024-05-09 MED ORDER — MIDAZOLAM HCL (PF) 2 MG/2ML IJ SOLN
INTRAMUSCULAR | Status: DC | PRN
  Administered 2024-05-09: 2 mg via INTRAVENOUS

## 2024-05-09 SURGICAL SUPPLY — 29 items
BAG DECANTER FOR FLEXI CONT (MISCELLANEOUS) ×1 IMPLANT
CHLORAPREP W/TINT 26 (MISCELLANEOUS) ×1 IMPLANT
COVER SURGICAL LIGHT HANDLE (MISCELLANEOUS) ×1 IMPLANT
COVER TRANSDUCER ULTRASND GEL (DISPOSABLE) IMPLANT
DERMABOND ADVANCED .7 DNX12 (GAUZE/BANDAGES/DRESSINGS) ×1 IMPLANT
DRAPE C-ARM 42X120 X-RAY (DRAPES) ×1 IMPLANT
DRAPE CHEST BREAST 15X10 FENES (DRAPES) ×1 IMPLANT
ELECT COATED BLADE 2.86 ST (ELECTRODE) ×1 IMPLANT
ELECTRODE REM PT RTRN 9FT ADLT (ELECTROSURGICAL) ×1 IMPLANT
GLOVE BIO SURGEON STRL SZ 6 (GLOVE) ×1 IMPLANT
GLOVE INDICATOR 6.5 STRL GRN (GLOVE) ×1 IMPLANT
GOWN STRL REUS W/ TWL LRG LVL3 (GOWN DISPOSABLE) ×1 IMPLANT
GOWN STRL REUS W/ TWL XL LVL3 (GOWN DISPOSABLE) ×1 IMPLANT
KIT BASIN OR (CUSTOM PROCEDURE TRAY) ×1 IMPLANT
KIT PORT POWER 8FR ISP CVUE (Port) IMPLANT
KIT TURNOVER KIT B (KITS) ×1 IMPLANT
NEEDLE 22X1.5 STRL (OR ONLY) (MISCELLANEOUS) ×1 IMPLANT
PAD ARMBOARD POSITIONER FOAM (MISCELLANEOUS) ×1 IMPLANT
PENCIL BUTTON HOLSTER BLD 10FT (ELECTRODE) ×1 IMPLANT
POSITIONER HEAD DONUT 9IN (MISCELLANEOUS) ×1 IMPLANT
SOLN 0.9% NACL POUR BTL 1000ML (IV SOLUTION) ×1 IMPLANT
SUT MON AB 4-0 PC3 18 (SUTURE) ×1 IMPLANT
SUT PROLENE 2 0 SH DA (SUTURE) ×2 IMPLANT
SUT VIC AB 3-0 SH 27X BRD (SUTURE) ×1 IMPLANT
SYR 5ML LUER SLIP (SYRINGE) ×1 IMPLANT
TOWEL GREEN STERILE (TOWEL DISPOSABLE) ×1 IMPLANT
TRAY LAPAROSCOPIC MC (CUSTOM PROCEDURE TRAY) ×1 IMPLANT
TUBE CONNECTING 12X1/4 (SUCTIONS) IMPLANT
YANKAUER SUCT BULB TIP NO VENT (SUCTIONS) IMPLANT

## 2024-05-09 NOTE — Transfer of Care (Signed)
 Immediate Anesthesia Transfer of Care Note  Patient: CLARETTA KENDRA  Procedure(s) Performed: INSERTION, TUNNELED CENTRAL VENOUS DEVICE, WITH PORT (Chest)  Patient Location: PACU  Anesthesia Type:General  Level of Consciousness: awake and drowsy  Airway & Oxygen Therapy: Patient Spontanous Breathing and Patient connected to face mask oxygen  Post-op Assessment: Report given to RN, Post -op Vital signs reviewed and stable, and Patient moving all extremities X 4  Post vital signs: Reviewed and stable  Last Vitals:  Vitals Value Taken Time  BP 138/75 05/09/24 11:00  Temp 36.4 C 05/09/24 11:00  Pulse 69 05/09/24 11:07  Resp 14 05/09/24 11:07  SpO2 96 % 05/09/24 11:07  Vitals shown include unfiled device data.  Last Pain:  Vitals:   05/09/24 1100  TempSrc:   PainSc: 0-No pain      Patients Stated Pain Goal: 1 (05/09/24 0826)  Complications: No notable events documented.

## 2024-05-09 NOTE — Discharge Instructions (Addendum)
[facility] Office Phone Number [phone]   POST OP INSTRUCTIONS  Always review your discharge instruction sheet given to you by the facility where your surgery was performed.  IF YOU HAVE DISABILITY OR FAMILY LEAVE FORMS, YOU MUST BRING THEM TO THE OFFICE FOR PROCESSING.  DO NOT GIVE THEM TO YOUR DOCTOR.  Take 2 tylenol (acetominophen) three times a day for 3 days.  If you still have pain, add ibuprofen with food in between if able to take this (if you have kidney issues or stomach issues, do not take ibuprofen).  If both of those are not enough, add the narcotic pain pill.  If you find you are needing a lot of this overnight after surgery, call the next morning for a refill.   Take your usually prescribed medications unless otherwise directed If you need a refill on your pain medication, please contact your pharmacy.  They will contact our office to request authorization.  Prescriptions will not be filled after 5pm or on week-ends. You should eat very light the first 24 hours after surgery, such as soup, crackers, pudding, etc.  Resume your normal diet the day after surgery It is common to experience some constipation if taking pain medication after surgery.  Increasing fluid intake and taking a stool softener will usually help or prevent this problem from occurring.  A mild laxative (Milk of Magnesia or Miralax) should be taken according to package directions if there are no bowel movements after 48 hours. You may shower in 48 hours.  The surgical glue will flake off in 2-3 weeks.   ACTIVITIES:  No strenuous activity or heavy lifting for 1 week.   You may drive when you no longer are taking prescription pain medication, you can comfortably wear a seatbelt, and you can safely maneuver your car and apply brakes. RETURN TO WORK:  __________to be determined. _______________ You should see your doctor in the office for a follow-up appointment approximately three-four weeks after  your surgery.    WHEN TO CALL YOUR DOCTOR: Fever over 101.0 Nausea and/or vomiting. Extreme swelling or bruising. Continued bleeding from incision. Increased pain, redness, or drainage from the incision.  The clinic staff is available to answer your questions during regular business hours.  Please don't hesitate to call and ask to speak to one of the nurses for clinical concerns.  If you have a medical emergency, go to the nearest emergency room or call 911.  A surgeon from [facility] is always on call at the hospital.  For further questions, please visit [email or web address]   

## 2024-05-09 NOTE — H&P (Signed)
 " PROVIDER: JINA CLAIR NEPHEW, MD Patient Care Team: Teresa Channel, MD as PCP - General (Family Medicine)  MRN: [id number] DOB: [birth year] DATE OF ENCOUNTER: 04/29/2024   Initial History:   Patient presented with new diagnosis of right breast cancer 02/2024. She had a palpable breast mass. She hadn't had mammography for several years before this. Diagnostic imaging was performed. This showed a 2.6 cm mass at 10:30 on the right with a negative axilla. 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   Her mother had a pre-cancerous lesion removed from the breast and her maternal aunt had ovarian cancer. Her father had polyps, which may be relevant to her genetic evaluation.  She has a history of arthritis and was considering knee surgery but opted to delay it. She also has a history of a bleeding stomach ulcer and a past head injury that resulted in a blood clot requiring surgery.  She is currently managing scaly skin lesions on her nose with a topical cream prescribed by her dermatologist. She has had multiple surgeries on her nose for similar issues in the past.  She is concerned about her diet and its impact on her cancer, specifically asking about sugar intake and processed foods.  She is retired and lives with two elderly mothers, aged 89 and 68, whom she helps care for.  Family cancer history - negative  Work -patient is retired, but her mother and her mother-in-law both live with her and her husband and she does a lot of physical help with them.   Interval History:   Patient had right breast lumpectomy and sentinel lymph node biopsy in mapping 03/25/2024. Unfortunately she was found to have 3 positive lymph nodes and so she subsequently underwent right axillary lymph node dissection on 04/16/2024.  History of Present Illness Melanie Reese is a 68 year old female with right breast cancer status post lumpectomy and  axillary lymph node dissection who presents for postoperative follow-up and drain management.  She is feeling well overall and denies significant pain. She uses analgesics only occasionally. Swelling in the right arm has improved since prior visits, and her range of motion is acceptable though not fully restored. She experiences numbness in the right arm but denies burning pain.  She has monitored her drain output and has met criteria for removal. She denies arm swelling or symptoms suggestive of lymphedema.  She tolerates her current medications, including phenobarbital  at night, without adverse effects. Gabapentin  is available for neuropathic symptoms but has not been required. She maintains her usual diet and is able to take vitamins as desired.  Pathology -lumpectomy/SLN bx 03/25/2024 A. 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 BREAST, RIGHT SUPERIOR MARGIN, EXCISION: - Benign breast tissue, negative for malignancy.  C. BREAST, RIGHT MEDIAL MARGIN, EXCISION: - Benign breast tissue, negative for malignancy.  D. BREAST, RIGHT INFERIOR MARGIN, EXCISION: - Benign breast tissue, negative for malignancy.  E. BREAST, RIGHT LATERAL MARGIN, EXCISION: - Benign breast tissue, negative for malignancy.  F. BREAST, RIGHT POSTERIOR MARGIN, EXCISION: - Benign  breast tissue, negative for malignancy.  G. BREAST, RIGHT ANTERIOR MARGIN, EXCISION: - Benign breast tissue, negative for malignancy.  H. LYMPH NODE, RIGHT AXILLARY #1, SENTINEL,  EXCISION: - 1 lymph node, negative for malignancy, bisected and multiple levels examined.  I. LYMPH NODE, RIGHT AXILLARY #2, SENTINEL, EXCISION: - 1 lymph node positive for malignancy (7 mm in greatest linear dimension).  Note: There is equivocal evidence of extracapsular extension; however, the evaluation is limited due to significant thermal/mechanical artifact present on the lymph node/tumor tissue which also raises the possibility of positive margin on the lymph node.  J. LYMPH NODE, RIGHT AXILLARY #3, SENTINEL, EXCISION: - 1 lymph node positive for malignancy (9 mm in greatest linear dimension) with extracapsular extension. Note: It should be noted that like the previous lymph node there is extensive thermal/mechanical artifact present on the lymph node with tumor which raises the possibility of positive margin on the lymph node.  K. LYMPH NODE, RIGHT AXILLARY #4, SENTINEL, EXCISION: - 1 lymph node positive for malignancy (6 mm in greatest linear dimension).  Note: There is equivocal evidence of extracapsular extension; however, evaluation is limited by significant thermal/mechanical artifact present on the lymph node containing tumor, which also raises the possibility of a positive margin on the lymph node.   Path ALND 04/16/2024 A. RIGHT AXILLARY CONTENTS, EXCISION:  Two of twenty-nine axillary lymph nodes with metastatic carcinoma (2/29,  see comment)  Largest metastasis measures 3.5 mm  Negative for extranodal extension  Fibroadipose and dense fibroconnective tissue with changes consistent  with prior procedure   Physical Examination:   Right breast and axilla: No evidence of infection. Some hematoma and swelling in the left breast, but this is minimal. No swelling in the axilla. Drain output is serosanguineous.    Assessment and Plan:    Diagnoses and all orders for this visit:  Malignant neoplasm of upper-outer quadrant of right breast in female, estrogen  receptor positive (CMS/HHS-HCC)  Family history of cancer  Oncology follow up today. Assessment & Plan Malignant neoplasm of upper-outer quadrant of right breast, estrogen receptor positive Status post right breast lumpectomy with axillary lymph node dissection. Pathology showed negative margins and 6 of 33 positive lymph nodes. Further oncologic management pending. - Referred to oncology for further management; appointment scheduled for later today. - Planned radiation therapy and discussed potential for additional systemic therapy based on oncology assessment. - Reinforced antihormone therapy. - Provided anticipatory guidance regarding possible future treatments and her sequencing.  Postoperative care following breast cancer surgery including axillary lymph node dissection Recovering well postoperatively with minimal pain, improving arm swelling, and no signs of infection. Numbness present without significant neuropathic pain. Range of motion acceptable. - Removed surgical drain as output criteria were met. - Provided wound care instructions: keep site covered until drainage ceases, then cover as needed for comfort. - Advised cleaning with soapy water and permitted full showers. - Discussed incision site glue; advised not to disturb glue for at least another week. - Reviewed pain management: may use pain medication as needed. - Discussed use of gabapentin  for neuropathic symptoms if burning pain develops. - Confirmed no contraindication to concurrent use of phenobarbital  and gabapentin , with appropriate spacing between doses. - Advised on bra and compression garment use for comfort, with option to alternate with regular bra. - Discussed travel precautions: advised to stop and ambulate for five minutes during car trips to reduce risk of postoperative thrombosis. - Cleared for family gathering, with instructions to avoid heavy lifting and strenuous  activity; encouraged delegation of tasks. -  Confirmed vitamins are permitted unless contraindicated by future oncologic therapy. - Scheduled physical therapy for rehabilitation and lymphedema prevention. - Provided follow-up schedule: routine surgical follow-up in six months, then annually for five years, with instructions to call for concerning symptoms (redness, swelling, infection).  Risk and management of postmastectomy lymphedema At increased risk for lymphedema due to extensive axillary lymph node dissection and planned radiation therapy. No current evidence of lymphedema. - Instructed that physical therapy will measure for compression sleeve and educate on lymphatic massage techniques. - Advised use of compression sleeve during air travel and with activities involving significant arm use (gardening, heavy lifting). - Educated on early signs of lymphedema and importance of prompt intervention. - Clarified that dietary sodium intake does not affect lymphedema risk, but is relevant for lower extremity edema. - Reinforced importance of regular follow-up and communication with physical therapy for early detection and management.   "

## 2024-05-09 NOTE — Anesthesia Postprocedure Evaluation (Signed)
"   Anesthesia Post Note  Patient: Melanie Reese  Procedure(s) Performed: INSERTION, TUNNELED CENTRAL VENOUS DEVICE, WITH PORT (Chest)     Patient location during evaluation: PACU Anesthesia Type: General Level of consciousness: sedated and patient cooperative Pain management: pain level controlled Vital Signs Assessment: post-procedure vital signs reviewed and stable Respiratory status: spontaneous breathing Cardiovascular status: stable Anesthetic complications: no   No notable events documented.  Last Vitals:  Vitals:   05/09/24 1115 05/09/24 1130  BP: (!) 142/85 (!) 148/73  Pulse: 60 60  Resp: 15 13  Temp:  (!) 36.4 C  SpO2: 96% 97%    Last Pain:  Vitals:   05/09/24 1130  TempSrc:   PainSc: 0-No pain                 Norleen Pope      "

## 2024-05-09 NOTE — Anesthesia Procedure Notes (Signed)
 Procedure Name: LMA Insertion Date/Time: 05/09/2024 9:52 AM  Performed by: Mollie Olivia SAUNDERS, CRNAPre-anesthesia Checklist: Patient identified, Emergency Drugs available, Suction available and Patient being monitored Patient Re-evaluated:Patient Re-evaluated prior to induction Oxygen Delivery Method: Circle System Utilized Preoxygenation: Pre-oxygenation with 100% oxygen Induction Type: IV induction Ventilation: Mask ventilation without difficulty LMA: LMA inserted LMA Size: 4.0 Number of attempts: 1 Airway Equipment and Method: Bite block Placement Confirmation: positive ETCO2 Tube secured with: Tape Dental Injury: Teeth and Oropharynx as per pre-operative assessment

## 2024-05-09 NOTE — Op Note (Signed)
 PREOPERATIVE DIAGNOSIS:  right breast cancer     POSTOPERATIVE DIAGNOSIS:  Same     PROCEDURE: Left subclavian port placement, Bard ClearVue Power Port, MRI safe, 8-French.      SURGEON:  Jina Nephew, MD      ANESTHESIA:  General   FINDINGS:  Good venous return, easy flush, and tip of the catheter and   SVC 21 cm.      SPECIMEN:  None.      ESTIMATED BLOOD LOSS:  Minimal.      COMPLICATIONS:  None known.      PROCEDURE:  Pt was identified in the holding area and taken to   the operating room, where patient was placed supine on the operating room   table.  General anesthesia was induced.  Patient's arms were tucked and the upper   chest and neck were prepped and draped in sterile fashion.  Time-out was   performed according to the surgical safety check list.  When all was   correct, we continued.   Local anesthetic was administered just under the angle of the left clavicle.  The vein was accessed with 1 pass(es) of the needle. There was good venous return and the wire passed easily with no ectopy.   Fluoroscopy was used to confirm that the wire was in the vena cava.      The patient was placed back level and the area for the pocket was anethetized   with local anesthetic.  A 3-cm transverse incision was made with a #15   blade.  Cautery was used to divide the subcutaneous tissues down to the   pectoralis muscle.  An [employer] retractor was used to elevate the skin   while a pocket was created on top of the pectoralis fascia.  The port   was placed into the pocket to confirm that it was of adequate size.  The   catheter was preattached to the port.  The port was then secured to the   pectoralis fascia with four 2-0 Prolene sutures.  These were clamped and   not tied down yet.    The catheter was tunneled through to the wire exit   site.  The catheter was placed along the wire to determine what length it should be to be in the SVC.  The catheter was cut at 21 cm.  The tunneler  sheath and dilator were passed over the wire and the dilator and wire were removed.  The catheter was advanced through the tunneler sheath and the tunneler sheath was pulled away.  Care was taken to keep the catheter in the tunneler sheath as this occurred. This was advanced and the tunneler sheath was removed.  There was good venous   return and easy flush of the catheter.  The Prolene sutures were tied   down to the pectoral fascia.  The skin was reapproximated using 3-0   Vicryl interrupted deep dermal sutures.    Fluoroscopy was used to re-confirm good position of the catheter.  The skin   was then closed using 4-0 Monocryl in a subcuticular fashion.  The port was flushed with concentrated heparin  flush as well.  The wounds were then cleaned, dried, and dressed with Dermabond.  The patient was awakened from anesthesia and taken to the PACU in stable condition.  Needle, sponge, and instrument counts were correct.               Jina Nephew, MD

## 2024-05-09 NOTE — Interval H&P Note (Signed)
 History and Physical Interval Note:  05/09/2024 9:10 AM  Melanie Reese  has presented today for surgery, with the diagnosis of RIGHT BREAST CANCER.  The various methods of treatment have been discussed with the patient and family. After consideration of risks, benefits and other options for treatment, the patient has consented to  Procedures with comments: INSERTION, TUNNELED CENTRAL VENOUS DEVICE, WITH PORT (N/A) - PORT PLACEMENT WITH ULTRASOUND GUIDANCE as a surgical intervention.  The patient's history has been reviewed, patient examined, no change in status, stable for surgery.  I have reviewed the patient's chart and labs.  Questions were answered to the patient's satisfaction.     Jina Nephew

## 2024-05-12 ENCOUNTER — Inpatient Hospital Stay

## 2024-05-12 ENCOUNTER — Inpatient Hospital Stay: Admitting: Pharmacist

## 2024-05-12 ENCOUNTER — Telehealth: Payer: Self-pay | Admitting: Licensed Clinical Social Worker

## 2024-05-12 ENCOUNTER — Encounter (HOSPITAL_COMMUNITY): Payer: Self-pay | Admitting: General Surgery

## 2024-05-12 NOTE — Telephone Encounter (Signed)
 [facility] Clinical Social Work  Clinical Social Work was referred by nurse for distress screen needs.  Clinical Social Worker attempted to contact patient by phone to offer support and assess for needs.   No answer. Left VM with direct contact information.     Follow Up Plan:  CSW will see patient on 1/6 during 1st infusion to introduce support services    Kynlea Blackston E Jullia Mulligan, LCSW  Clinical Social Worker [facility]

## 2024-05-12 NOTE — Progress Notes (Signed)
 Pharmacist Chemotherapy Monitoring - Initial Assessment    Anticipated start date: 05/20/2024   The following has been reviewed per standard work regarding the patient's treatment regimen: The patient's diagnosis, treatment plan and drug doses, and organ/hematologic function Lab orders and baseline tests specific to treatment regimen  The treatment plan start date, drug sequencing, and pre-medications Prior authorization status  Patient's documented medication list, including drug-drug interaction screen and prescriptions for anti-emetics and supportive care specific to the treatment regimen The drug concentrations, fluid compatibility, administration routes, and timing of the medications to be used The patient's access for treatment and lifetime cumulative dose history, if applicable  The patient's medication allergies and previous infusion related reactions, if applicable   Changes made to treatment plan:  N/A  Follow up needed:  Pending authorization for treatment    Melanie Reese, RPH, 05/12/2024  8:38 AM

## 2024-05-13 ENCOUNTER — Ambulatory Visit

## 2024-05-13 DIAGNOSIS — C50411 Malignant neoplasm of upper-outer quadrant of right female breast: Secondary | ICD-10-CM

## 2024-05-13 DIAGNOSIS — M25611 Stiffness of right shoulder, not elsewhere classified: Secondary | ICD-10-CM

## 2024-05-13 DIAGNOSIS — Z483 Aftercare following surgery for neoplasm: Secondary | ICD-10-CM

## 2024-05-13 DIAGNOSIS — R293 Abnormal posture: Secondary | ICD-10-CM

## 2024-05-13 DIAGNOSIS — Z9189 Other specified personal risk factors, not elsewhere classified: Secondary | ICD-10-CM

## 2024-05-13 NOTE — Therapy (Signed)
 " OUTPATIENT PHYSICAL THERAPY BREAST CANCER TREATMENT   Patient Name: Melanie Reese MRN: [id number] DOB:June 25, 1955, 68 y.o., female Today's Date: 05/13/2024  END OF SESSION:  PT End of Session - 05/13/24 1221     Visit Number 4    Number of Visits 11    Date for Recertification  06/02/24    Authorization Type needs    PT Start Time 1210    PT Stop Time 1304    PT Time Calculation (min) 54 min    Activity Tolerance Patient tolerated treatment well    Behavior During Therapy WFL for tasks assessed/performed           Past Medical History:  Diagnosis Date   Anemia    Arthritis    Basal cell carcinoma    on face   Cancer (HCC)    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 Last seizure was 1983 per patient.   Past Surgical History:  Procedure Laterality Date   AXILLARY LYMPH NODE DISSECTION Right 04/16/2024   Procedure: REDGIE HARD;  Surgeon: Aron Shoulders, MD;  Location: [facility];  Service: General;  Laterality: Right;  RIGHT AXILLARY LYMPH NODE DISSECTION   BRAIN SURGERY     1975, 1976 plate in head and burrow from head injury   BREAST BIOPSY Right 02/22/2024   US  RT BREAST BX W LOC DEV 1ST LESION IMG BX SPEC US  GUIDE 02/22/2024 [facility]MO[facility]   BREAST LUMPECTOMY Right 03/25/2024   Procedure: BREAST LUMPECTOMY;  Surgeon: Aron Shoulders, MD;  Location: [facility];  Service: General;  Laterality: Right;   COLONOSCOPY     DRUG INDUCED ENDOSCOPY     PORTACATH PLACEMENT N/A 05/09/2024   Procedure: INSERTION, TUNNELED CENTRAL VENOUS DEVICE, WITH PORT;  Surgeon: Aron Shoulders, MD;  Location: [facility];  Service: General;  Laterality: N/A;  PORT PLACEMENT  WITH ULTRASOUND GUIDANCE   SENTINEL NODE BIOPSY Right 03/25/2024   Procedure: BIOPSY, LYMPH NODE, SENTINEL;  Surgeon: Aron Shoulders, MD;  Location: [facility];  Service: General;  Laterality: Right;  GEN w/PEC BLOCK RIGHT BREAST LUMPECTOMY RIGHT SENTINEL LYMPH NODE BIOPSY   Patient Active Problem List   Diagnosis Date Noted   Malignant neoplasm of upper-outer quadrant of right breast in female, estrogen receptor positive (HCC) 03/11/2024   Metatarsalgia of both feet 03/20/2017   Knee pain, bilateral 03/20/2017    PCP: Montie Pizza, MD   REFERRING PROVIDER: Shoulders Aron, MD   REFERRING DIAG: C50.411,Z17.0 (ICD-10-CM) - Malignant neoplasm of upper-outer quadrant of right breast in female, estrogen receptor positive (HCC)     THERAPY DIAG:  Abnormal posture  Malignant neoplasm of upper-outer quadrant of right breast in female, estrogen receptor positive (HCC)  Aftercare following surgery for neoplasm  At risk for lymphedema  Stiffness of right shoulder, not elsewhere classified  Rationale for Evaluation and Treatment: Rehabilitation  ONSET DATE: 02/25/2024   SUBJECTIVE:  SUBJECTIVE STATEMENT: I got my port in since I was here last and that has me so worried. I've been doing the stretches though and I can move my arm a bit more.  PERTINENT HISTORY:  Patient was diagnosed on 02/25/2024 with right grade 2 invasive ductal carcinoma. It measures 2.6 cm and is located in the upper-outer quadrant. It is ER/PR +, HER2 - with a Ki67 of 30%.  She is s/p a right lumpectomy with SLNB on 03/25/2024 with 3+/4 LN's.  ALND on 04/16/2024 with 2/29 nodes positive.  Will be having chemotherapy.    PATIENT GOALS:  Reassess how my recovery is going related to arm function, pain, and swelling.  PAIN:   Are you having pain? No  PRECAUTIONS: Recent Surgery, right UE Lymphedema risk,   RED FLAGS: None   ACTIVITY LEVEL / LEISURE: most home activities except vaccuming   OBJECTIVE:   PATIENT SURVEYS:   QUICK DASH: 45% from 34% last visit  OBSERVATIONS: Bruising noted all around areola present just last 2 days; see photo in media  POSTURE:  Forward head and rounded shoulders posture   LYMPHEDEMA ASSESSMENT:  UPPER EXTREMITY AROM/PROM:   A/PROM RIGHT   eval   RIGHT 04/15/2024 05/05/24  Shoulder extension 63 52 45  Shoulder flexion 152 153 108  Shoulder abduction 145 148 90 - cording noted  Shoulder internal rotation 54 67   Shoulder external rotation 75 102                           (Blank rows = not tested)   A/PROM LEFT   eval  Shoulder extension 52  Shoulder flexion 152  Shoulder abduction 156  Shoulder internal rotation 61  Shoulder external rotation 81                          (Blank rows = not tested)   CERVICAL AROM: All within normal limits:      Percent limited  Flexion WFL  Extension WFL  Right lateral flexion WFL  Left lateral flexion WFL  Right rotation WFL  Left rotation WFL      LYMPHEDEMA ASSESSMENTS (in cm):    LANDMARK RIGHT   eval RIGHT 04/15/2024 05/05/24  10 cm proximal to olecranon process from proximal aspect of olecranon 30.1 30.3 31.2  Olecranon process 24.1 24.1 25  10  cm proximal to ulnar styloid process from proximal aspect of styloid process 21.7 22.2 21.9  Just distal to ulnar styloid process 15.4 15.3 15.6  Across hand at thumb web space 18.6 18.3   At base of 2nd digit 6.5 6.2 6.5  (Blank rows = not tested)   LANDMARK LEFT   eval  10 cm proximal to olecranon process from proximal aspect of olecranon 30.9   Olecranon process 23.8  10 cm proximal to ulnar styloid process from proximal aspect of styloid process 20.7  Just distal to ulnar styloid process 15.6  Across hand at thumb web space 17.5  At base of 2nd digit  6.1  (Blank rows = not tested)  Surgery type/Date: 03/25/2024 Right lumpectomy with SLNB Number of lymph nodes removed: 3+/4 LN's, pending lymphadenectomy on 04/16/2024 Current/past treatment (chemo, radiation, hormone therapy):  Other symptoms:  Heaviness/tightness Yes Pain Yes Pitting edema No Infections No Decreased scar mobility Yes Stemmer sign No  TODAYS TREATMENT 05/13/24: Therapeutic Exercises Pulleys into flex x 2 mins with VC's and demo to decrease  Rt scapular compensation Roll yellow ball up wall into flex x 7 and stopped due to UE fatigue Manual Therapy P/ROM to Rt shoulder in supine into flex, abd and D2 with scapular depression by therapist throughout MFR gently to multiple cords in axilla, pt currently very ttp   05/05/24 Lymphedivas size medium short pt will check online to see what she wants to order and has to self order due to no diagnosis of lymphedema.  Discussed post op instructions per below as well as scar massage review.  Iscussed POC and cording     PATIENT EDUCATION:  Education details: discussed and demonstrated scar massage to areolar incision but not to axillary incision until healed from surgery tomorrow, discussed prophylactic sleeve that we will help her get, SOZO screen to set up after next visit, gave video information if she wants to watch today, reviewed HEP and discussed importance taking to tightness only.. Advised to perform in a week after surgery or if she has drains, after they are removed. Scheduled follow up in 3 weeks Person educated: Patient Education method: Explanation, Demonstration, and Handouts Education comprehension: verbalized understanding  HOME EXERCISE PROGRAM: Reviewed previously given post op HEP.   ASSESSMENT:  CLINICAL IMPRESSION: Began AA/ROM stretches and then manual therapy to decrease fascial restrictions in Rt axilla. Pt had multiple good questions about lymphedema, and upcoming chemo and radiation. All  questions were answered within scope of practice. Encouraged pt to watch ABC class for further lymphedema risk reduction education as well.    Pt will benefit from skilled therapeutic intervention to improve on the following deficits: Decreased knowledge of precautions, impaired UE functional use, pain, decreased ROM, postural dysfunction.   PT treatment/interventions: ADL/Self care home management, [phone]- PT Re-evaluation, 97110-Therapeutic exercises, 97530- Therapeutic activity, [id number]- Neuromuscular re-education, 97535- Self Care, [id number]- Manual therapy, and Patient/Family education   GOALS: Goals reviewed with patient? Yes  GOALS MET AT EVAL:  GOALS Name Target Date Goal status  1 Pt will be able to verbalize understanding of pertinent lymphedema risk reduction practices relevant to her dx specifically related to skin care.  Baseline:  No knowledge Eval Achieved at eval  2 Pt will be able to return demo and/or verbalize understanding of the post op HEP related to regaining shoulder ROM. Baseline:  No knowledge Eval Achieved at eval  3 Pt will be able to verbalize understanding of the importance of viewing the post op After Breast CA Class video for further lymphedema risk reduction education and therapeutic exercise.  Baseline:  No knowledge Eval Achieved at eval   LONG TERM GOALS:  (STG=LTG)  GOALS Name Target Date  Goal status  1 Pt will demonstrate she has regained full shoulder ROM and function post operatively compared to baselines.  Baseline: After next surgery on 04/16/2024 06/02/2024 INITIAL  2 Pt will be fit for compression sleeve for prophylactic reasons 06/02/24 MET  3 Pt will have understanding of lymphedema precautions and will have all questions answered 06/02/24 ONGOING  4   INITIAL     PLAN:  PT FREQUENCY/DURATION: 2x per week x 4 weeks. Including therapeutic exercise, manual therapy, self care, therapeutic activities and re-eval.  Orthotic fit training as well.    PLAN FOR NEXT SESSION: set up SOZO screens, cont Rt AAROM/PROM and cording (gentle to start), watch Abc class?    [facility]  [address], [address]  [place] [address]  [phone]    Aden Berwyn Caldron, PTA 05/13/2024, 1:12 PM  "

## 2024-05-18 ENCOUNTER — Other Ambulatory Visit: Payer: Self-pay

## 2024-05-18 NOTE — Progress Notes (Signed)
 "  PROVIDER:  JINA CLAIR NEPHEW, MD Patient Care Team: Teresa Channel, MD as PCP - General (Family Medicine)  MRN: [id number] DOB: Sep 05, 1955 DATE OF ENCOUNTER: 05/19/2024 Initial History:    Patient presented with new diagnosis of right breast cancer 02/2024.  She had a palpable breast mass.  She hadn't had mammography for several years before this.  Diagnostic imaging was performed.  This showed a 2.6 cm mass at 10:30 on the right with a negative axilla.  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    History of Present Illness Melanie Reese is a 69 year old female who presents with a breast mass found during a routine mammogram. She was referred for further evaluation after a biopsy confirmed cancer in the breast mass.   She initially discovered a mass in her breast, mistaking it for her rib or bone. A routine mammogram, which she had not undergone in a couple of years, led to further evaluation. The mammogram and subsequent biopsy confirmed the presence of cancer in the breast mass.   Her mother had a pre-cancerous lesion removed from the breast and her maternal aunt had ovarian cancer. Her father had polyps, which may be relevant to her genetic evaluation.   She has a history of arthritis and was considering knee surgery but opted to delay it. She also has a history of a bleeding stomach ulcer and a past head injury that resulted in a blood clot requiring surgery.   She is currently managing scaly skin lesions on her nose with a topical cream prescribed by her dermatologist. She has had multiple surgeries on her nose for similar issues in the past.   She is concerned about her diet and its impact on her cancer, specifically asking about sugar intake and processed foods.   She is retired and lives with two elderly mothers, aged 55 and 67, whom she helps care for.     Family cancer history - negative   Work -patient is  retired, but her mother and her mother-in-law both live with her and her husband and she does a lot of physical help with them.   Interval History:   Patient had right breast lumpectomy and sentinel lymph node biopsy in mapping 03/25/2024.  Unfortunately she was found to have 3 positive lymph nodes and so she subsequently underwent right axillary lymph node dissection on 04/16/2024. An additional 2/29 lymph nodes were positive for final stage of pT2N2a.  Port was placed 12/26.    History of Present Illness Melanie Reese is a 69 year old female with right breast cancer status post lumpectomy, axillary lymph node dissection, and recent vascular port placement who presents for pre-chemotherapy evaluation and management of post-procedural symptoms.  She is scheduled to begin chemotherapy tomorrow and seeks confirmation of medical stability. Since right chest port placement on December 26, she has experienced mild swelling at the port site. She continues to use a compression bra.  She describes muscle tightness and pain in the LEFT pectoralis region radiating to the neck, aggravated by movement including mastication. She describes discomfort with associated limitation in neck and arm mobility. She experiences intermittent burning, tingling, and numbness on the RIGHT. She does not currently use muscle relaxants but has tizanidine available and inquires about gabapentin  for neuropathic symptoms.  She expresses significant anxiety related to the vascular port and upcoming chemotherapy, stating that the port is freaking me out, despite previously tolerating surgery  well. She voices concern about performing physical therapy and activity with the port in place.  She remains cautious with activity, having avoided heavy lifting since port placement, and is concerned about resuming tasks such as washing clothes and exercising. She is able to walk, squat, and exercise her legs, and plans to walk on the track. She  inquires about driving.  She finds her current compression bra too tight and uncomfortable, often leaving it unfastened, and is interested in switching to a more comfortable option. She asks about wearing a bra during sleep and primarily sleeps on her back with her head elevated, occasionally on her side. She has questions about wound care, specifically regarding the surgical glue at the port site, which is starting to come off, and asks about using a loofah.   Pathology -lumpectomy/SLN bx 03/25/2024 A. 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 BREAST, RIGHT SUPERIOR MARGIN, EXCISION: -  Benign breast tissue, negative for malignancy.  C. BREAST, RIGHT MEDIAL MARGIN, EXCISION: -  Benign breast tissue, negative for malignancy.  D. BREAST, RIGHT INFERIOR MARGIN, EXCISION: -  Benign breast tissue, negative for malignancy.  E. BREAST, RIGHT LATERAL MARGIN, EXCISION: -  Benign breast tissue, negative for malignancy.  F. BREAST, RIGHT POSTERIOR MARGIN, EXCISION: -  Benign breast tissue, negative for malignancy.  G. BREAST, RIGHT ANTERIOR MARGIN, EXCISION: -  Benign breast tissue, negative for malignancy.  H. LYMPH NODE, RIGHT AXILLARY #1, SENTINEL, EXCISION: -  1 lymph node, negative for malignancy, bisected and multiple levels examined.  I. LYMPH NODE, RIGHT AXILLARY #2, SENTINEL, EXCISION: -  1 lymph node positive for malignancy (7 mm in greatest linear dimension).  Note: There is equivocal evidence of extracapsular  extension; however, the evaluation is limited due to significant thermal/mechanical artifact present on the lymph node/tumor tissue which also raises the possibility of positive margin on the lymph node.  J. LYMPH NODE, RIGHT AXILLARY #3, SENTINEL, EXCISION: -  1 lymph node positive for malignancy (9 mm in greatest linear dimension) with extracapsular extension. Note: It should be noted that like the previous lymph node there is extensive thermal/mechanical artifact present on the lymph node with tumor which raises the possibility of positive margin on the lymph node.  K. LYMPH NODE, RIGHT AXILLARY #4, SENTINEL, EXCISION: -  1 lymph node positive for malignancy (6 mm in greatest linear dimension).  Note: There is equivocal evidence of extracapsular extension; however, evaluation is limited by significant thermal/mechanical artifact present on the lymph node containing tumor, which also raises the possibility of a positive margin on the lymph node.   Path ALND 04/16/2024 A. RIGHT AXILLARY CONTENTS, EXCISION:  Two of twenty-nine axillary lymph nodes with metastatic carcinoma (2/29,  see comment)  Largest metastasis measures 3.5 mm  Negative for extranodal extension  Fibroadipose and dense fibroconnective tissue with changes consistent  with prior procedure     Physical Examination:   Right breast and axilla: right breast lymphedema inferolateral.  No appreciable arm lymphedema.  Mild left neck swelling.     Assessment and  Plan:       Diagnoses and all orders for this visit:  Malignant neoplasm of upper-outer quadrant of right breast in female, estrogen receptor positive (CMS/HHS-HCC) -     tiZANidine (ZANAFLEX) 2 MG tablet; Take 1 tablet (2 mg total) by mouth 3 (three) times daily -     gabapentin  (NEURONTIN ) 100 MG capsule; Take 1 capsule (100 mg total) by mouth 3 (three) times daily  Family history of cancer   Assessment & Plan Malignant neoplasm of upper-outer  quadrant of right breast, estrogen receptor positive Status post right breast lumpectomy with axillary lymph node dissection. Pathology showed negative margins and 6 of 33 positive lymph nodes. Further oncologic management pending.  Status post right breast cancer surgery, preparing for chemotherapy. Mild anxiety related to vascular port and impending chemotherapy, but medically stable for treatment. - Cleared for chemotherapy tomorrow. - Provided guidance regarding port identification card for imaging and power injection requirements. - Reassured regarding mild bruising and muscle tightness near port site.  Postmastectomy breast lymphedema Persistent mild breast lymphedema following axillary lymph node dissection. No evidence of infection or severe complications. Swelling is manageable with compression. - Advised continued use of compression garments. - Referred to physical therapy for evaluation and management, including massage techniques and compression sleeve fitting. Sent direct message to them.   - Discussed switching to a more comfortable compression bra and provided guidance on selection. - Educated regarding sleeping positions and their impact on lymphedema. - Advised gradual return to normal activities and avoidance of lifting heavier than a small suitcase. - Discussed use of heating pad on low setting for comfort, avoiding direct skin contact.  Management of vascular port and postoperative pain Muscle tightness, mild bruising, and pain at the port site attributed to pectoralis muscle irritation from port placement. Intermittent neuropathic symptoms and anxiety related to the port and upcoming chemotherapy. - Prescribed tizanidine for muscle tightness and pain. - Advised use of muscle relaxant prior to physical therapy sessions, with caution regarding driving until effects are known. - Prescribed gabapentin  100 mg for neuropathic pain and tingling. - Provided adhesive remover for  surgical glue at port site. - Discussed gradual increase in activity and safe lifting techniques. - Reassured regarding normal postoperative muscle pain and tightness. - Discussed anxiety management and recommended oncology follow-up for consideration of lorazepam for anxiety and antiemetic therapy during chemotherapy.       Return in about 6 months (around 11/16/2024) for breast cancer follow up.   The plan was discussed in detail with the patient today, who expressed understanding.  The patient has my contact information, and understands to call me with any additional questions or concerns in the interval.  I would be happy to see the patient back sooner if the need arises.   JINA CLAIR NEPHEW, MD  "

## 2024-05-19 ENCOUNTER — Other Ambulatory Visit (HOSPITAL_COMMUNITY)

## 2024-05-20 ENCOUNTER — Inpatient Hospital Stay (HOSPITAL_BASED_OUTPATIENT_CLINIC_OR_DEPARTMENT_OTHER): Admitting: Adult Health

## 2024-05-20 ENCOUNTER — Inpatient Hospital Stay: Attending: Hematology and Oncology

## 2024-05-20 ENCOUNTER — Inpatient Hospital Stay

## 2024-05-20 ENCOUNTER — Inpatient Hospital Stay: Admitting: Licensed Clinical Social Worker

## 2024-05-20 ENCOUNTER — Encounter: Payer: Self-pay | Admitting: Adult Health

## 2024-05-20 VITALS — BP 129/75 | HR 81 | Temp 97.9°F | Resp 17 | Wt 153.2 lb

## 2024-05-20 VITALS — BP 119/70 | HR 87 | Temp 97.8°F | Resp 16

## 2024-05-20 DIAGNOSIS — C50411 Malignant neoplasm of upper-outer quadrant of right female breast: Secondary | ICD-10-CM

## 2024-05-20 DIAGNOSIS — F419 Anxiety disorder, unspecified: Secondary | ICD-10-CM | POA: Insufficient documentation

## 2024-05-20 DIAGNOSIS — Z5111 Encounter for antineoplastic chemotherapy: Secondary | ICD-10-CM | POA: Insufficient documentation

## 2024-05-20 DIAGNOSIS — Z87891 Personal history of nicotine dependence: Secondary | ICD-10-CM | POA: Insufficient documentation

## 2024-05-20 DIAGNOSIS — Z170 Estrogen receptor positive status [ER+]: Secondary | ICD-10-CM

## 2024-05-20 DIAGNOSIS — Z79899 Other long term (current) drug therapy: Secondary | ICD-10-CM | POA: Insufficient documentation

## 2024-05-20 DIAGNOSIS — Z1732 Human epidermal growth factor receptor 2 negative status: Secondary | ICD-10-CM | POA: Insufficient documentation

## 2024-05-20 DIAGNOSIS — Z1721 Progesterone receptor positive status: Secondary | ICD-10-CM | POA: Insufficient documentation

## 2024-05-20 DIAGNOSIS — Z17411 Hormone receptor positive with human epidermal growth factor receptor 2 negative status: Secondary | ICD-10-CM | POA: Insufficient documentation

## 2024-05-20 DIAGNOSIS — Z8041 Family history of malignant neoplasm of ovary: Secondary | ICD-10-CM | POA: Insufficient documentation

## 2024-05-20 DIAGNOSIS — I890 Lymphedema, not elsewhere classified: Secondary | ICD-10-CM | POA: Insufficient documentation

## 2024-05-20 DIAGNOSIS — T451X5A Adverse effect of antineoplastic and immunosuppressive drugs, initial encounter: Secondary | ICD-10-CM | POA: Insufficient documentation

## 2024-05-20 DIAGNOSIS — Z5189 Encounter for other specified aftercare: Secondary | ICD-10-CM | POA: Insufficient documentation

## 2024-05-20 LAB — CBC WITH DIFFERENTIAL (CANCER CENTER ONLY)
Abs Immature Granulocytes: 0.01 K/uL (ref 0.00–0.07)
Basophils Absolute: 0 K/uL (ref 0.0–0.1)
Basophils Relative: 1 %
Eosinophils Absolute: 0.1 K/uL (ref 0.0–0.5)
Eosinophils Relative: 1 %
HCT: 37.1 % (ref 36.0–46.0)
Hemoglobin: 12.2 g/dL (ref 12.0–15.0)
Immature Granulocytes: 0 %
Lymphocytes Relative: 20 %
Lymphs Abs: 1.1 K/uL (ref 0.7–4.0)
MCH: 30.1 pg (ref 26.0–34.0)
MCHC: 32.9 g/dL (ref 30.0–36.0)
MCV: 91.6 fL (ref 80.0–100.0)
Monocytes Absolute: 0.4 K/uL (ref 0.1–1.0)
Monocytes Relative: 7 %
Neutro Abs: 3.7 K/uL (ref 1.7–7.7)
Neutrophils Relative %: 71 %
Platelet Count: 249 K/uL (ref 150–400)
RBC: 4.05 MIL/uL (ref 3.87–5.11)
RDW: 12.7 % (ref 11.5–15.5)
WBC Count: 5.2 K/uL (ref 4.0–10.5)
nRBC: 0 % (ref 0.0–0.2)

## 2024-05-20 LAB — CMP (CANCER CENTER ONLY)
ALT: 15 U/L (ref 0–44)
AST: 24 U/L (ref 15–41)
Albumin: 4.4 g/dL (ref 3.5–5.0)
Alkaline Phosphatase: 92 U/L (ref 38–126)
Anion gap: 8 (ref 5–15)
BUN: 11 mg/dL (ref 8–23)
CO2: 25 mmol/L (ref 22–32)
Calcium: 9 mg/dL (ref 8.9–10.3)
Chloride: 103 mmol/L (ref 98–111)
Creatinine: 0.79 mg/dL (ref 0.44–1.00)
GFR, Estimated: >60 mL/min
Glucose, Bld: 122 mg/dL — ABNORMAL HIGH (ref 70–99)
Potassium: 4.1 mmol/L (ref 3.5–5.1)
Sodium: 136 mmol/L (ref 135–145)
Total Bilirubin: 0.2 mg/dL (ref 0.0–1.2)
Total Protein: 6.6 g/dL (ref 6.5–8.1)

## 2024-05-20 MED ORDER — ALPRAZOLAM 0.25 MG PO TABS: 0.2500 mg | ORAL_TABLET | Freq: Every day | ORAL | 0 refills | Status: AC | PRN

## 2024-05-20 MED ORDER — APREPITANT 130 MG/18ML IV EMUL
130.0000 mg | Freq: Once | INTRAVENOUS | Status: AC
  Administered 2024-05-20: 130 mg via INTRAVENOUS
  Filled 2024-05-20: qty 18

## 2024-05-20 MED ORDER — DOXORUBICIN HCL CHEMO IV INJECTION 2 MG/ML
60.0000 mg/m2 | Freq: Once | INTRAVENOUS | Status: AC
  Administered 2024-05-20: 104 mg via INTRAVENOUS
  Filled 2024-05-20: qty 52

## 2024-05-20 MED ORDER — SODIUM CHLORIDE 0.9 % IV SOLN
600.0000 mg/m2 | Freq: Once | INTRAVENOUS | Status: AC
  Administered 2024-05-20: 1000 mg via INTRAVENOUS
  Filled 2024-05-20: qty 50

## 2024-05-20 MED ORDER — PALONOSETRON HCL INJECTION 0.25 MG/5ML
0.2500 mg | Freq: Once | INTRAVENOUS | Status: AC
  Administered 2024-05-20: 0.25 mg via INTRAVENOUS
  Filled 2024-05-20: qty 5

## 2024-05-20 MED ORDER — DEXAMETHASONE SOD PHOSPHATE PF 10 MG/ML IJ SOLN
10.0000 mg | Freq: Once | INTRAMUSCULAR | Status: AC
  Administered 2024-05-20: 10 mg via INTRAVENOUS
  Filled 2024-05-20: qty 1

## 2024-05-20 MED ORDER — SODIUM CHLORIDE 0.9 % IV SOLN: INTRAVENOUS | Status: DC

## 2024-05-20 NOTE — Patient Instructions (Signed)
 [facility] - A DEPT OF [facility]  Discharge Instructions: Thank you for choosing [facility] to provide your oncology and hematology care.   If you have a lab appointment with the Cancer Center, please go directly to the Cancer Center and check in at the registration area.   Wear comfortable clothing and clothing appropriate for easy access to any Portacath or PICC line.   We strive to give you quality time with your provider. You may need to reschedule your appointment if you arrive late (15 or more minutes).  Arriving late affects you and other patients whose appointments are after yours.  Also, if you miss three or more appointments without notifying the office, you may be dismissed from the clinic at the provider's discretion.      For prescription refill requests, have your pharmacy contact our office and allow 72 hours for refills to be completed.    Today you received the following chemotherapy and/or immunotherapy agents: Doxorubicin  (Adriamycin ) & Cyclophosphamide  (Cytoxan )     To help prevent nausea and vomiting after your treatment, we encourage you to take your nausea medication as directed.  BELOW ARE SYMPTOMS THAT SHOULD BE REPORTED IMMEDIATELY: *FEVER GREATER THAN 100.4 F (38 C) OR HIGHER *CHILLS OR SWEATING *NAUSEA AND VOMITING THAT IS NOT CONTROLLED WITH YOUR NAUSEA MEDICATION *UNUSUAL SHORTNESS OF BREATH *UNUSUAL BRUISING OR BLEEDING *URINARY PROBLEMS (pain or burning when urinating, or frequent urination) *BOWEL PROBLEMS (unusual diarrhea, constipation, pain near the anus) TENDERNESS IN MOUTH AND THROAT WITH OR WITHOUT PRESENCE OF ULCERS (sore throat, sores in mouth, or a toothache) UNUSUAL RASH, SWELLING OR PAIN  UNUSUAL VAGINAL DISCHARGE OR ITCHING   Items with * indicate a potential emergency and should be followed up as soon as possible or go to the Emergency Department if any problems should occur.  Please show  the CHEMOTHERAPY ALERT CARD or IMMUNOTHERAPY ALERT CARD at check-in to the Emergency Department and triage nurse.  Should you have questions after your visit or need to cancel or reschedule your appointment, please contact [facility] - A DEPT OF [facility]  Dept: [phone]  and follow the prompts.  Office hours are 8:00 a.m. to 4:30 p.m. Monday - Friday. Please note that voicemails left after 4:00 p.m. may not be returned until the following business day.  We are closed weekends and major holidays. You have access to a nurse at all times for urgent questions. Please call the main number to the clinic Dept: [phone] and follow the prompts.   For any non-urgent questions, you may also contact your provider using MyChart. We now offer e-Visits for anyone 110 and older to request care online for non-urgent symptoms. For details visit [email or web address].   Also download the MyChart app! Go to the app store, search MyChart, open the app, select [facility], and log in with your MyChart username and password.

## 2024-05-20 NOTE — Progress Notes (Signed)
 [facility] Clinical Social Work  Initial Assessment   Rochel NEMIAH BUBAR is a 69 y.o. year old female accompanied by spouse. Clinical Social Work was referred by distress screen protocol for distress screen needs.   SDOH (Social Determinants of Health) assessments performed: Yes   SDOH Screenings   Food Insecurity: No Food Insecurity (05/20/2024)  Housing: Unknown (05/20/2024)  Transportation Needs: No Transportation Needs (05/20/2024)  Utilities: Not At Risk (05/20/2024)  Alcohol Screen: Low Risk (05/20/2024)  Depression (PHQ2-9): Low Risk (05/20/2024)  Financial Resource Strain: Low Risk (05/20/2024)  Physical Activity: Insufficiently Active (05/20/2024)  Social Connections: Unknown (05/20/2024)  Stress: No Stress Concern Present (05/20/2024)  Tobacco Use: Low Risk (05/20/2024)  Recent Concern: Tobacco Use - Medium Risk (05/19/2024)   Received from [facility]  Health Literacy: Adequate Health Literacy (05/20/2024)    PHQ 2/9:    05/20/2024   12:23 PM 05/20/2024   12:21 PM 05/20/2024   12:19 PM  Depression screen PHQ 2/9  Decreased Interest 0 0 0  Down, Depressed, Hopeless 0 0 0  PHQ - 2 Score 0 0 0     Distress Screen completed: Yes    05/06/2024    6:17 PM  ONCBCN DISTRESS SCREENING  Screening Type Initial Screening  How much distress have you been experiencing in the past week? (0-10) 5  Practical concerns type Taking care of others  Emotional concerns type Worry or anxiety;Grief or loss  Physical Concerns Type  Pain      Family/Social Information:  Housing Arrangement: patient lives with her husband, her mom, and her mother-in-law. She and her husband help them, but they are mostly independent still Family members/support persons in your life? 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  Income source: retirement income Financial concerns: No Type of concern: None Food access  concerns: no Advanced directives: Not known Services Currently in place:  [employer]  Coping/ Adjustment to diagnosis: Patient understands treatment plan and what happens next? yes, had surgery and is now starting chemo. She has been doing well overall, with the most anxiety around port placement. She has lost her hair before (59yrs ago due to an accident) and thinks she will cope well with that change. Concerns about diagnosis and/or treatment: I'm not especially worried about anything Patient reported stressors: Adjusting to my illness Patient enjoys time with family/ friends and art Current coping skills/ strengths: Ability for insight , Active sense of humor , Capable of independent living , Communication skills , Motivation for treatment/growth , and Supportive family/friends     SUMMARY: Current SDOH Barriers:  No major barriers identified today  Clinical Social Work Clinical Goal(s):  No clinical social work goals at this time  Interventions: Discussed common feeling and emotions when being diagnosed with cancer, and the importance of support during treatment Informed patient of the support team roles and support services at [facility] and [employer] Provided CSW contact information and encouraged patient to call with any questions or concerns   Follow Up Plan: Patient will contact CSW with any support or resource needs Patient verbalizes understanding of plan: Yes    Marvell Stavola E Colvin Blatt, LCSW Clinical Social Worker [facility]

## 2024-05-20 NOTE — Progress Notes (Signed)
 [facility] Cancer Follow up:    Teresa Channel, MD [phone] [address] [place] [address]   DIAGNOSIS: Cancer Staging  Malignant neoplasm of upper-outer quadrant of right breast in female, estrogen receptor positive (HCC) Staging form: Breast, AJCC 8th Edition - Clinical: Stage IB (cT2, cN0, cM0, G2, ER+, PR+, HER2-) - Signed by Odean Potts, MD on 03/12/2024 Stage prefix: Initial diagnosis Histologic grading system: 3 grade system - Pathologic stage from 04/29/2024: Stage IB (pT2, pN2, cM0, G2, ER+, PR+, HER2-) - Signed by Odean Potts, MD on 04/29/2024 Histologic grading system: 3 grade system    SUMMARY OF ONCOLOGIC HISTORY: Oncology History  Malignant neoplasm of upper-outer quadrant of right breast in female, estrogen receptor positive (HCC)  02/22/2024 Initial Diagnosis   Patient had a palpable lump in the right breast measuring 2.6 cm by ultrasound biopsy revealed 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%, 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, axilla negative   03/12/2024 Cancer Staging   Staging form: Breast, AJCC 8th Edition - Clinical: Stage IB (cT2, cN0, cM0, G2, ER+, PR+, HER2-) - Signed by Odean Potts, MD on 03/12/2024 Stage prefix: Initial diagnosis Histologic grading system: 3 grade system   03/25/2024 Surgery   Right lumpectomy: 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, margins negative, 3/4 lymph nodes positive 04/16/2024: ALND: 2/29 lymph nodes positive (overall 5/33 lymph nodes)   04/29/2024 Cancer Staging   Staging form: Breast, AJCC 8th Edition - Pathologic stage from 04/29/2024: Stage IB (pT2, pN2, cM0, G2, ER+, PR+, HER2-) - Signed by Odean Potts, MD on 04/29/2024 Histologic grading system: 3 grade system   05/20/2024 -  Chemotherapy   Patient is on Treatment Plan : BREAST DOSE DENSE AC q14d / PACLitaxel q7d       CURRENT THERAPY: adriamycin /cytoxan   INTERVAL HISTORY:  Discussed the use of AI scribe software for clinical note transcription with the patient,  who gave verbal consent to proceed.  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  She is scheduled to begin her first cycle of adjuvant Adriamycin  and Cytoxan  chemotherapy today after lumpectomy for a 3.1 cm, grade 2 invasive ductal carcinoma with 5 of 33 lymph nodes positive. She understands her diagnosis and that chemotherapy is adjuvant.  She had significant situational anxiety around port placement and chemotherapy, which improved after port access today. She is specifically concerned about nausea, anorexia, dysgeusia, and alopecia. She has reviewed post-chemotherapy biohazard precautions and prepared home supplies. She plans to use protein supplements if appetite declines, has no dietary restrictions or diabetes, and follows food safety precautions including avoidance of raw or undercooked foods, careful washing of produce, and avoidance of grapefruit.  She has mild right upper extremity lymphedema after surgery and wears a compression bra. She notes mild right chest pain and xerosis at the surgical site and seeks guidance on skin care during chemotherapy. She has basal cell carcinoma with nasal lesions and has deferred topical treatment during chemotherapy.  She has situational anxiety focused on port access and treatment days and prefers as-needed rather than daily anxiolytics. Her surgeon started gabapentin  and trazodone yesterday. She takes phenobarbital  nightly and is concerned about drug interactions. She has not needed significant analgesia.  She remains physically active, walks regularly, and wants to maintain core strength during chemotherapy. She received influenza and pneumococcal vaccines this  year and follows infection precautions with masking, hand hygiene, and avoidance of buffets and salad  bars. She asks about safe contact with her grandchildren during treatment.     Patient Active Problem List   Diagnosis Date Noted   Malignant neoplasm of upper-outer quadrant of right breast in female, estrogen receptor positive (HCC) 03/11/2024   Metatarsalgia of both feet 03/20/2017   Knee pain, bilateral 03/20/2017    is allergic to doxycycline, nsaids, chlorhexidine  gluconate, and sulfa antibiotics.  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 Last seizure was 1983 per patient.    SURGICAL HISTORY: Past Surgical History:  Procedure Laterality Date   AXILLARY LYMPH NODE DISSECTION Right 04/16/2024   Procedure: REDGIE HARD;  Surgeon: Aron Shoulders, MD;  Location: [facility] OR;  Service: General;  Laterality: Right;  RIGHT AXILLARY LYMPH NODE DISSECTION   BRAIN SURGERY     1975, 1976 plate in head and burrow from head injury   BREAST BIOPSY Right 02/22/2024   US  RT BREAST BX W LOC DEV 1ST LESION IMG BX SPEC US  GUIDE 02/22/2024 [facility] MAMMOGRAPHY   BREAST LUMPECTOMY Right 03/25/2024   Procedure: BREAST LUMPECTOMY;  Surgeon: Aron Shoulders, MD;  Location: [facility];  Service: General;  Laterality: Right;   COLONOSCOPY     DRUG INDUCED ENDOSCOPY     PORTACATH PLACEMENT N/A 05/09/2024   Procedure: INSERTION, TUNNELED CENTRAL VENOUS DEVICE, WITH PORT;  Surgeon: Aron Shoulders, MD;  Location: [facility] OR;  Service: General;  Laterality: N/A;  PORT PLACEMENT WITH ULTRASOUND GUIDANCE   SENTINEL NODE BIOPSY Right 03/25/2024   Procedure: BIOPSY,  LYMPH NODE, SENTINEL;  Surgeon: Aron Shoulders, MD;  Location: [facility];  Service: General;  Laterality: Right;  GEN w/PEC BLOCK RIGHT BREAST LUMPECTOMY RIGHT SENTINEL LYMPH NODE BIOPSY    SOCIAL HISTORY: Social History   Socioeconomic History   Marital status: Single    Spouse name: Not on file   Number of children: Not on file   Years of education: Not on file   Highest education level: Not on file  Occupational History   Not on file  Tobacco Use   Smoking status: Never   Smokeless tobacco: Never  Vaping Use   Vaping status: Never Used  Substance and Sexual Activity   Alcohol use: Yes    Comment: rare   Drug use: Never   Sexual activity: Not Currently    Birth control/protection: Post-menopausal  Other Topics Concern   Not on file  Social History Narrative   Not on file   Social Drivers of Health   Tobacco Use: Low Risk (05/20/2024)   Patient History    Smoking Tobacco Use: Never    Smokeless Tobacco Use: Never    Passive Exposure: Not on file  Recent Concern: Tobacco Use - Medium Risk (05/19/2024)   Received from [facility]   Patient History    Smoking Tobacco Use: Former    Smokeless Tobacco Use:  Never    Passive Exposure: Not on file  Financial Resource Strain: Low Risk (05/20/2024)   Overall Financial Resource Strain (CARDIA)    Difficulty of Paying Living Expenses: Not hard at all  Food Insecurity: No Food Insecurity (05/20/2024)   Epic    Worried About Programme Researcher, Broadcasting/film/video in the Last Year: Never true    Ran Out of Food in the Last Year: Never true  Transportation Needs: No Transportation Needs (05/20/2024)   Epic    Lack of Transportation (Medical): No    Lack of Transportation (Non-Medical): No  Physical Activity: Insufficiently Active (05/20/2024)   Exercise Vital Sign    Days of Exercise per Week: 7 days    Minutes of Exercise per Session: 20 min  Stress: No Stress Concern Present (05/20/2024)   [employer] of  Occupational Health - Occupational Stress Questionnaire    Feeling of Stress: Not at all  Social Connections: Unknown (05/20/2024)   Social Connection and Isolation Panel    Frequency of Communication with Friends and Family: Three times a week    Frequency of Social Gatherings with Friends and Family: Not on file    Attends Religious Services: Not on file    Active Member of Clubs or Organizations: Not on file    Attends Banker Meetings: Not on file    Marital Status: Not on file  Intimate Partner Violence: Not At Risk (05/20/2024)   Epic    Fear of Current or Ex-Partner: No    Emotionally Abused: No    Physically Abused: No    Sexually Abused: No  Depression (PHQ2-9): Low Risk (05/20/2024)   Depression (PHQ2-9)    PHQ-2 Score: 0  Alcohol Screen: Low Risk (05/20/2024)   Alcohol Screen    Last Alcohol Screening Score (AUDIT): 0  Housing: Unknown (05/20/2024)   Epic    Unable to Pay for Housing in the Last Year: No    Number of Times Moved in the Last Year: Not on file    Homeless in the Last Year: No  Utilities: Not At Risk (05/20/2024)   Epic    Threatened with loss of utilities: No  Health Literacy: Adequate Health Literacy (05/20/2024)   B1300 Health Literacy    Frequency of need for help with medical instructions: Never    FAMILY HISTORY: Family History  Problem Relation Age of Onset   Ovarian cancer Maternal Aunt    Bone cancer Maternal Grandfather    Breast cancer Neg Hx     Review of Systems  Constitutional:  Negative for appetite change, chills, fatigue, fever and unexpected weight change.  HENT:   Negative for hearing loss, lump/mass and trouble swallowing.   Eyes:  Negative for eye problems and icterus.  Respiratory:  Negative for chest tightness, cough and shortness of breath.   Cardiovascular:  Negative for chest pain, leg swelling and palpitations.  Gastrointestinal:  Negative for abdominal distention, abdominal pain, constipation, diarrhea, nausea and  vomiting.  Endocrine: Negative for hot flashes.  Genitourinary:  Negative for difficulty urinating.   Musculoskeletal:  Negative for arthralgias.  Skin:  Negative for itching and rash.  Neurological:  Negative for dizziness, extremity weakness, headaches and numbness.  Hematological:  Negative for adenopathy. Does not bruise/bleed easily.  Psychiatric/Behavioral:  Negative for depression. The patient is nervous/anxious.       PHYSICAL EXAMINATION   Onc Performance Status - 05/20/24 1223       ECOG Perf Status   ECOG Perf Status  Restricted in physically strenuous activity but ambulatory and able to carry out work of a light or sedentary nature, e.g., light house work, office work      KPS SCALE   KPS % SCORE Able to carry on normal activity, minor s/s of disease          Vitals:   05/20/24 1200  BP: 129/75  Pulse: 81  Resp: 17  Temp: 97.9 F (36.6 C)  SpO2: 100%    Physical Exam Constitutional:      General: She is not in acute distress.    Appearance: Normal appearance. She is not toxic-appearing.  HENT:     Head: Normocephalic and atraumatic.     Mouth/Throat:     Mouth: Mucous membranes are moist.     Pharynx: Oropharynx is clear. No oropharyngeal exudate or posterior oropharyngeal erythema.  Eyes:     General: No scleral icterus. Cardiovascular:     Rate and Rhythm: Normal rate and regular rhythm.     Pulses: Normal pulses.     Heart sounds: Normal heart sounds.  Pulmonary:     Effort: Pulmonary effort is normal.     Breath sounds: Normal breath sounds.  Abdominal:     General: Abdomen is flat. Bowel sounds are normal. There is no distension.     Palpations: Abdomen is soft.     Tenderness: There is no abdominal tenderness.  Musculoskeletal:        General: No swelling.     Cervical back: Neck supple.  Lymphadenopathy:     Cervical: No cervical adenopathy.  Skin:    General: Skin is warm and dry.     Findings: No rash.  Neurological:     General:  No focal deficit present.     Mental Status: She is alert.  Psychiatric:        Mood and Affect: Mood normal.        Behavior: Behavior normal.     LABORATORY DATA:  CBC    Component Value Date/Time   WBC 5.2 05/20/2024 1119   WBC 4.1 04/16/2024 0947   RBC 4.05 05/20/2024 1119   HGB 12.2 05/20/2024 1119   HCT 37.1 05/20/2024 1119   PLT 249 05/20/2024 1119   MCV 91.6 05/20/2024 1119   MCH 30.1 05/20/2024 1119   MCHC 32.9 05/20/2024 1119   RDW 12.7 05/20/2024 1119   LYMPHSABS 1.1 05/20/2024 1119   MONOABS 0.4 05/20/2024 1119   EOSABS 0.1 05/20/2024 1119   BASOSABS 0.0 05/20/2024 1119    CMP     Component Value Date/Time   NA 136 05/20/2024 1119   K 4.1 05/20/2024 1119   CL 103 05/20/2024 1119   CO2 25 05/20/2024 1119   GLUCOSE 122 (H) 05/20/2024 1119   BUN 11 05/20/2024 1119   CREATININE 0.79 05/20/2024 1119   CALCIUM 9.0 05/20/2024 1119   PROT 6.6 05/20/2024 1119   ALBUMIN 4.4 05/20/2024 1119   AST 24 05/20/2024 1119   ALT 15 05/20/2024 1119   ALKPHOS 92 05/20/2024 1119   BILITOT 0.2 05/20/2024 1119   GFRNONAA >60 05/20/2024 1119     ASSESSMENT and THERAPY PLAN:     Assessment and Plan Assessment & Plan Stage IB ER/PR positive invasive ductal carcinoma of the right breast, post-lumpectomy, receiving adjuvant chemotherapy - Patient will proceed with cycle one of adjuvant AC chemotherapy. - Educated on chemotherapy side effects: not limited to nausea, dysgeusia, infection risk, alopecia. - Advised saline mouth rinses for oral mucositis;  report oral ulcerations for possible mouthwash prescription. - Instructed to monitor for fever, chills; call immediately due to neutropenia risk. - Advised mask use in public, hand hygiene, avoid buffets/salad bars during chemotherapy. - Advised regular skin care (Aveeno/Aquaphor); defer basal cell carcinoma treatment until post-chemotherapy. - Discussed saline nasal spray for dryness/epistaxis; anticipated loss of nasal  hair, eyelashes, eyebrows. - Encouraged exercise; advised rest as needed.  Lymphedema of right upper extremity Mild lymphedema post-lymph node dissection, managed with compression bra. Scheduled for physical therapy. - Continue compression bra for lymphedema management. - Attend scheduled physical therapy; may reschedule if unwell post-chemotherapy or on G-CSF injection days.  Chemotherapy-related anxiety Situational anxiety related to chemotherapy, improved after port access. Prefers as-needed management, concerned about drug interactions. Prescribed gabapentin  and trazodone. - Prescribed low-dose anxiolytic PRN for situational anxiety prior to chemotherapy; take only when needed, have a driver if taken before treatment. - Advised to avoid concurrent use of anxiolytic with alcohol, oxycodone , gabapentin , muscle relaxants; space out doses if needed. - Instructed to notify provider if anxiety becomes persistent for consideration of long-term management.  RTC in 2 weeks for labs, f/u and next treatment.     All questions were answered. The patient knows to call the clinic with any problems, questions or concerns. We can certainly see the patient much sooner if necessary.  Total encounter time:40 minutes*in face-to-face visit time, chart review, lab review, care coordination, order entry, and documentation of the encounter time.    Morna Kendall, NP 05/20/2024 12:31 PM Medical Oncology and Hematology [facility] [address] [place], [place] [address] Tel. [phone]    Fax. [phone]  *Total Encounter Time as defined by the Centers for Medicare and Medicaid Services includes, in addition to the face-to-face time of a patient visit (documented in the note above) non-face-to-face time: obtaining and reviewing outside history, ordering and reviewing medications, tests or procedures, care coordination (communications with other health care professionals or caregivers) and  documentation in the medical record.

## 2024-05-21 ENCOUNTER — Ambulatory Visit: Payer: Self-pay | Admitting: *Deleted

## 2024-05-21 ENCOUNTER — Other Ambulatory Visit: Payer: Self-pay

## 2024-05-21 ENCOUNTER — Ambulatory Visit: Attending: General Surgery

## 2024-05-21 DIAGNOSIS — Z170 Estrogen receptor positive status [ER+]: Secondary | ICD-10-CM

## 2024-05-21 DIAGNOSIS — Z483 Aftercare following surgery for neoplasm: Secondary | ICD-10-CM | POA: Insufficient documentation

## 2024-05-21 DIAGNOSIS — Z9189 Other specified personal risk factors, not elsewhere classified: Secondary | ICD-10-CM | POA: Diagnosis present

## 2024-05-21 DIAGNOSIS — C50411 Malignant neoplasm of upper-outer quadrant of right female breast: Secondary | ICD-10-CM | POA: Diagnosis present

## 2024-05-21 DIAGNOSIS — R293 Abnormal posture: Secondary | ICD-10-CM | POA: Diagnosis present

## 2024-05-21 DIAGNOSIS — M25611 Stiffness of right shoulder, not elsewhere classified: Secondary | ICD-10-CM | POA: Diagnosis present

## 2024-05-21 NOTE — Therapy (Signed)
 " OUTPATIENT PHYSICAL THERAPY BREAST CANCER TREATMENT   Patient Name: Melanie Reese MRN: [id number] DOB:08/01/1955, 69 y.o., female Today's Date: 05/21/2024  END OF SESSION:  PT End of Session - 05/21/24 1509     Visit Number 5    Number of Visits 11    Date for Recertification  06/02/24    Authorization Type needs    PT Start Time 1509    PT Stop Time 1603    PT Time Calculation (min) 54 min    Activity Tolerance Patient tolerated treatment well    Behavior During Therapy WFL for tasks assessed/performed           Past Medical History:  Diagnosis Date   Anemia    Arthritis    Basal cell carcinoma    on face   Cancer (HCC)    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 Last seizure was 1983 per patient.   Past Surgical History:  Procedure Laterality Date   AXILLARY LYMPH NODE DISSECTION Right 04/16/2024   Procedure: REDGIE HARD;  Surgeon: Aron Shoulders, MD;  Location: [facility];  Service: General;  Laterality: Right;  RIGHT AXILLARY LYMPH NODE DISSECTION   BRAIN SURGERY     1975, 1976 plate in head and burrow from head injury   BREAST BIOPSY Right 02/22/2024   US  RT BREAST BX W LOC DEV 1ST LESION IMG BX SPEC US  GUIDE 02/22/2024 [facility] MAMMOGRAPHY   BREAST LUMPECTOMY Right 03/25/2024   Procedure: BREAST LUMPECTOMY;  Surgeon: Aron Shoulders, MD;  Location: [facility];  Service: General;  Laterality: Right;   COLONOSCOPY     DRUG INDUCED ENDOSCOPY     PORTACATH PLACEMENT N/A 05/09/2024   Procedure: INSERTION, TUNNELED CENTRAL VENOUS DEVICE, WITH PORT;  Surgeon: Aron Shoulders, MD;  Location: [facility];  Service: General;  Laterality: N/A;  PORT PLACEMENT WITH  ULTRASOUND GUIDANCE   SENTINEL NODE BIOPSY Right 03/25/2024   Procedure: BIOPSY, LYMPH NODE, SENTINEL;  Surgeon: Aron Shoulders, MD;  Location: [facility];  Service: General;  Laterality: Right;  GEN w/PEC BLOCK RIGHT BREAST LUMPECTOMY RIGHT SENTINEL LYMPH NODE BIOPSY   Patient Active Problem List   Diagnosis Date Noted   Malignant neoplasm of upper-outer quadrant of right breast in female, estrogen receptor positive (HCC) 03/11/2024   Metatarsalgia of both feet 03/20/2017   Knee pain, bilateral 03/20/2017    PCP: Montie Pizza, MD   REFERRING PROVIDER: Shoulders Aron, MD   REFERRING DIAG: C50.411,Z17.0 (ICD-10-CM) - Malignant neoplasm of upper-outer quadrant of right breast in female, estrogen receptor positive (HCC)     THERAPY DIAG:  Abnormal posture  Malignant neoplasm of upper-outer quadrant of right breast in female, estrogen receptor positive (HCC)  Aftercare following surgery for neoplasm  At risk for lymphedema  Stiffness of right shoulder, not elsewhere classified  Rationale for Evaluation and Treatment: Rehabilitation  ONSET DATE: 02/25/2024   SUBJECTIVE:  SUBJECTIVE STATEMENT: I did pretty well after last visit. I just saw Dr. Aron on Monday and she said I have a little mild MLD in my breast. I just had my first chemo yesterday and it went well. I asked the MD to lok at my breast because it felt a little puffy.. I am wearing my compression bra. I ordered the pulleys and they should arrive by Friday.  PERTINENT HISTORY:  Patient was diagnosed on 02/25/2024 with right grade 2 invasive ductal carcinoma. It measures 2.6 cm and is located in the upper-outer quadrant. It is ER/PR +, HER2 - with a Ki67 of 30%.  She is s/p a right lumpectomy with SLNB on 03/25/2024 with 3+/4  LN's.  ALND on 04/16/2024 with 2/29 nodes positive.  Will be having chemotherapy.     PATIENT GOALS:  Reassess how my recovery is going related to arm function, pain, and swelling.  PAIN:  Are you having pain? No  PRECAUTIONS: Recent Surgery, right UE Lymphedema risk,   RED FLAGS: None   ACTIVITY LEVEL / LEISURE: most home activities except vaccuming   OBJECTIVE:   PATIENT SURVEYS:   QUICK DASH: 45% from 34% last visit  OBSERVATIONS: Bruising noted all around areola present just last 2 days; see photo in media  POSTURE:  Forward head and rounded shoulders posture   LYMPHEDEMA ASSESSMENT:  UPPER EXTREMITY AROM/PROM:   A/PROM RIGHT   eval   RIGHT 04/15/2024 05/05/24  Shoulder extension 63 52 45  Shoulder flexion 152 153 108  Shoulder abduction 145 148 90 - cording noted  Shoulder internal rotation 54 67   Shoulder external rotation 75 102                           (Blank rows = not tested)   A/PROM LEFT   eval  Shoulder extension 52  Shoulder flexion 152  Shoulder abduction 156  Shoulder internal rotation 61  Shoulder external rotation 81                          (Blank rows = not tested)   CERVICAL AROM: All within normal limits:      Percent limited  Flexion WFL  Extension WFL  Right lateral flexion WFL  Left lateral flexion WFL  Right rotation WFL  Left rotation WFL      LYMPHEDEMA ASSESSMENTS (in cm):    LANDMARK RIGHT   eval RIGHT 04/15/2024 05/05/24  10 cm proximal to olecranon process from proximal aspect of olecranon 30.1 30.3 31.2  Olecranon process 24.1 24.1 25  10  cm proximal to ulnar styloid process from proximal aspect of styloid process 21.7 22.2 21.9  Just distal to ulnar styloid process 15.4 15.3 15.6  Across hand at thumb web space 18.6 18.3   At base of 2nd digit 6.5 6.2 6.5  (Blank rows = not tested)   LANDMARK LEFT   eval  10 cm proximal to olecranon process from proximal aspect of olecranon 30.9   Olecranon process 23.8   10 cm proximal to ulnar styloid process from proximal aspect of styloid process 20.7  Just distal to ulnar styloid process 15.6  Across hand at thumb web space 17.5  At base of 2nd digit 6.1  (Blank rows = not tested)  Surgery type/Date: 03/25/2024 Right lumpectomy with SLNB Number of lymph nodes removed: 3+/4 LN's, pending lymphadenectomy on 04/16/2024 Current/past treatment (chemo, radiation, hormone therapy):  Other symptoms:  Heaviness/tightness Yes Pain Yes Pitting edema No Infections No Decreased scar mobility Yes Stemmer sign No  TODAYS TREATMENT  05/21/2024  Overhead pulleys flexion and scaption x 2 min ea Discussed purchasing her sleeve; she will check at [facility] when she goes Friday Checked Right breast;generalized swelling greatest at lateral breast; several enlarged pores but not many In supine: Short neck, 5 diaphragmatic breaths, L axillary nodes and establishment of interaxillary pathway, R inguinal nodes and establishment of axilloinguinal pathway, then R breast moving fluid towards pathways spending extra time in any areas of fibrosis then retracing all steps. PT performed MLD for right breast swelling and educated pt while performing. PROM right shoulder flex, scaption, abd, ER Supine AROM bilateral flexion x 3, scaption x 3, horizontal abd x 4 Discussed video and gave pt sticky note with how to get to it from our website since she could not find it. Also wrote down name and size of sleeve so she can check at [facility] when she goes Friday, but also suggested she check [employer].  05/13/24: Therapeutic Exercises Pulleys into flex x 2 mins with VC's and demo to decrease Rt scapular compensation Roll yellow ball up wall into flex x 7 and stopped due to UE fatigue Manual Therapy P/ROM to Rt shoulder in supine into flex, abd and D2 with scapular depression by therapist throughout MFR gently to multiple cords in axilla, pt currently very ttp    05/05/24 Lymphedivas size medium short pt will check online to see what she wants to order and has to self order due to no diagnosis of lymphedema.  Discussed post op instructions per below as well as scar massage review.  Iscussed POC and cording     PATIENT EDUCATION:  Education details: discussed and demonstrated scar massage to areolar incision but not to axillary incision until healed from surgery tomorrow, discussed prophylactic sleeve that we will help her get, SOZO screen to set up after next visit, gave video information if she wants to watch today, reviewed HEP and discussed importance taking to tightness only.. Advised to perform in a week after surgery or if she has drains, after they are removed. Scheduled follow up in 3 weeks Person educated: Patient Education method: Explanation, Demonstration, and Handouts Education comprehension: verbalized understanding  HOME EXERCISE PROGRAM: Reviewed previously given post op HEP.   ASSESSMENT:  CLINICAL IMPRESSION: Initiated MLD to right breast due to swelling concerns noted by MD and educated pt while performing. Pts right shoulder ROM doing very well overall, however, her arm fels trembly with supine AROM. Advised pt to get her compression sleeve and wrote down the name and size she was advised to get. Also wrote down how to navigate to video.  Pt will benefit from skilled therapeutic intervention to improve on the following deficits: Decreased knowledge of precautions, impaired UE functional use, pain, decreased ROM, postural dysfunction.   PT treatment/interventions: ADL/Self care home management, [phone]- PT Re-evaluation, 97110-Therapeutic exercises, 97530- Therapeutic activity, [id number]- Neuromuscular re-education, 97535- Self Care, [id number]- Manual therapy, and Patient/Family education   GOALS: Goals reviewed with patient? Yes  GOALS MET AT EVAL:  GOALS Name Target Date Goal status  1 Pt will be able to verbalize understanding  of pertinent lymphedema risk reduction practices relevant to her dx specifically related to skin care.  Baseline:  No knowledge Eval Achieved at eval  2 Pt will be able to return demo and/or verbalize understanding of the post op HEP related to regaining  shoulder ROM. Baseline:  No knowledge Eval Achieved at eval  3 Pt will be able to verbalize understanding of the importance of viewing the post op After Breast CA Class video for further lymphedema risk reduction education and therapeutic exercise.  Baseline:  No knowledge Eval Achieved at eval   LONG TERM GOALS:  (STG=LTG)  GOALS Name Target Date  Goal status  1 Pt will demonstrate she has regained full shoulder ROM and function post operatively compared to baselines.  Baseline: After next surgery on 04/16/2024 06/02/2024 INITIAL  2 Pt will be fit for compression sleeve for prophylactic reasons 06/02/24 MET  3 Pt will have understanding of lymphedema precautions and will have all questions answered 06/02/24 ONGOING  4   INITIAL     PLAN:  PT FREQUENCY/DURATION: 2x per week x 4 weeks. Including therapeutic exercise, manual therapy, self care, therapeutic activities and re-eval.  Orthotic fit training as well.   PLAN FOR NEXT SESSION: set up SOZO screens, cont Rt AAROM/PROM and cording (gentle to start), watch Abc class?    [facility]  [address]  [place] [address]  [phone]    Grayce JINNY Sheldon, PT 05/21/2024, 4:12 PM  "

## 2024-05-22 ENCOUNTER — Encounter: Payer: Self-pay | Admitting: *Deleted

## 2024-05-22 ENCOUNTER — Inpatient Hospital Stay

## 2024-05-22 ENCOUNTER — Other Ambulatory Visit: Payer: Self-pay | Admitting: *Deleted

## 2024-05-22 ENCOUNTER — Encounter: Payer: Self-pay | Admitting: Hematology and Oncology

## 2024-05-22 ENCOUNTER — Ambulatory Visit (HOSPITAL_COMMUNITY)
Admission: RE | Admit: 2024-05-22 | Discharge: 2024-05-22 | Disposition: A | Discharge status: Home / Self Care | Source: Ambulatory Visit | Attending: Hematology and Oncology | Admitting: Hematology and Oncology

## 2024-05-22 VITALS — BP 129/68 | HR 76 | Temp 98.2°F | Resp 17

## 2024-05-22 DIAGNOSIS — C50411 Malignant neoplasm of upper-outer quadrant of right female breast: Secondary | ICD-10-CM | POA: Insufficient documentation

## 2024-05-22 DIAGNOSIS — R229 Localized swelling, mass and lump, unspecified: Secondary | ICD-10-CM

## 2024-05-22 DIAGNOSIS — Z170 Estrogen receptor positive status [ER+]: Secondary | ICD-10-CM

## 2024-05-22 MED ORDER — PEGFILGRASTIM-CBQV 6 MG/0.6ML ~~LOC~~ SOSY
6.0000 mg | PREFILLED_SYRINGE | Freq: Once | SUBCUTANEOUS | Status: AC
  Administered 2024-05-22: 6 mg via SUBCUTANEOUS
  Filled 2024-05-22: qty 0.6

## 2024-05-22 NOTE — Progress Notes (Signed)
 Left upper extremity venous duplex has been completed. Preliminary results can be found in CV Proc through chart review.  Results were given to [facility] at Dr. Gara office.  05/22/2024 10:22 AM Cathlyn Collet RVT

## 2024-05-23 ENCOUNTER — Ambulatory Visit: Admitting: Rehabilitation

## 2024-05-23 ENCOUNTER — Encounter: Payer: Self-pay | Admitting: Rehabilitation

## 2024-05-23 DIAGNOSIS — Z9189 Other specified personal risk factors, not elsewhere classified: Secondary | ICD-10-CM

## 2024-05-23 DIAGNOSIS — R293 Abnormal posture: Secondary | ICD-10-CM

## 2024-05-23 DIAGNOSIS — Z170 Estrogen receptor positive status [ER+]: Secondary | ICD-10-CM

## 2024-05-23 DIAGNOSIS — Z483 Aftercare following surgery for neoplasm: Secondary | ICD-10-CM

## 2024-05-23 DIAGNOSIS — M25611 Stiffness of right shoulder, not elsewhere classified: Secondary | ICD-10-CM

## 2024-05-23 NOTE — Therapy (Signed)
 " OUTPATIENT PHYSICAL THERAPY BREAST CANCER TREATMENT   Patient Name: Melanie Reese MRN: [id number] DOB:Sep 01, 1955, 69 y.o., female Today's Date: 05/23/2024  END OF SESSION:  PT End of Session - 05/23/24 0953     Visit Number 6    Number of Visits 11    Date for Recertification  06/02/24    Authorization Type 9 visits 05/05/24-06/02/24    Authorization - Visit Number 6    Authorization - Number of Visits 9    PT Start Time 0904    PT Stop Time 0953    PT Time Calculation (min) 49 min    Activity Tolerance Patient tolerated treatment well    Behavior During Therapy [facility] for tasks assessed/performed           Past Medical History:  Diagnosis Date   Anemia    Arthritis    Basal cell carcinoma    on face   Cancer (HCC)    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 Last seizure was 1983 per patient.   Past Surgical History:  Procedure Laterality Date   AXILLARY LYMPH NODE DISSECTION Right 04/16/2024   Procedure: REDGIE HARD;  Surgeon: Aron Shoulders, MD;  Location: [facility];  Service: General;  Laterality: Right;  RIGHT AXILLARY LYMPH NODE DISSECTION   BRAIN SURGERY     1975, 1976 plate in head and burrow from head injury   BREAST BIOPSY Right 02/22/2024   US  RT BREAST BX W LOC DEV 1ST LESION IMG BX SPEC US  GUIDE 02/22/2024 [facility] MAMMOGRAPHY   BREAST LUMPECTOMY Right 03/25/2024   Procedure: BREAST LUMPECTOMY;  Surgeon: Aron Shoulders, MD;  Location: [facility];  Service: General;  Laterality: Right;   COLONOSCOPY     DRUG INDUCED ENDOSCOPY     PORTACATH PLACEMENT N/A 05/09/2024   Procedure: INSERTION, TUNNELED CENTRAL VENOUS DEVICE, WITH PORT;  Surgeon:  Aron Shoulders, MD;  Location: [facility];  Service: General;  Laterality: N/A;  PORT PLACEMENT WITH ULTRASOUND GUIDANCE   SENTINEL NODE BIOPSY Right 03/25/2024   Procedure: BIOPSY, LYMPH NODE, SENTINEL;  Surgeon: Aron Shoulders, MD;  Location: [facility];  Service: General;  Laterality: Right;  GEN w/PEC BLOCK RIGHT BREAST LUMPECTOMY RIGHT SENTINEL LYMPH NODE BIOPSY   Patient Active Problem List   Diagnosis Date Noted   Malignant neoplasm of upper-outer quadrant of right breast in female, estrogen receptor positive (HCC) 03/11/2024   Metatarsalgia of both feet 03/20/2017   Knee pain, bilateral 03/20/2017    PCP: Montie Pizza, MD   REFERRING PROVIDER: Shoulders Aron, MD   REFERRING DIAG: C50.411,Z17.0 (ICD-10-CM) - Malignant neoplasm of upper-outer quadrant of right breast in female, estrogen receptor positive (HCC)     THERAPY DIAG:  Abnormal posture  Malignant neoplasm of upper-outer quadrant of right breast in female, estrogen receptor positive (HCC)  Aftercare following surgery for neoplasm  At risk for lymphedema  Stiffness of right shoulder, not elsewhere classified  Rationale for Evaluation and Treatment: Rehabilitation  ONSET DATE: 02/25/2024   SUBJECTIVE:  SUBJECTIVE STATEMENT: They did a doppler of my neck and arm yesterday.  Because of the puffiness around the port.    PERTINENT HISTORY:  Patient was diagnosed on 02/25/2024 with right grade 2 invasive ductal carcinoma. It measures 2.6 cm and is located in the upper-outer quadrant. It is ER/PR +, HER2 - with a Ki67 of 30%.  She is s/p a right lumpectomy with SLNB on 03/25/2024 with 3+/4 LN's.  ALND on 04/16/2024 with 2/29 nodes positive.  Will be having chemotherapy.     PATIENT GOALS:  Reassess how my recovery is going related  to arm function, pain, and swelling.  PAIN:  Are you having pain? The Rt side just feels weird and tight.    PRECAUTIONS: Recent Surgery, right UE Lymphedema risk,   RED FLAGS: None   ACTIVITY LEVEL / LEISURE: most home activities except vaccuming   OBJECTIVE:   PATIENT SURVEYS:   QUICK DASH: 45% from 34% last visit  OBSERVATIONS: Bruising noted all around areola present just last 2 days; see photo in media  POSTURE:  Forward head and rounded shoulders posture   LYMPHEDEMA ASSESSMENT:  UPPER EXTREMITY AROM/PROM:   A/PROM RIGHT   eval   RIGHT 04/15/2024 05/05/24  Shoulder extension 63 52 45  Shoulder flexion 152 153 108  Shoulder abduction 145 148 90 - cording noted  Shoulder internal rotation 54 67   Shoulder external rotation 75 102                           (Blank rows = not tested)   A/PROM LEFT   eval  Shoulder extension 52  Shoulder flexion 152  Shoulder abduction 156  Shoulder internal rotation 61  Shoulder external rotation 81                          (Blank rows = not tested)   CERVICAL AROM: All within normal limits:      Percent limited  Flexion WFL  Extension WFL  Right lateral flexion WFL  Left lateral flexion WFL  Right rotation WFL  Left rotation WFL      LYMPHEDEMA ASSESSMENTS (in cm):    LANDMARK RIGHT   eval RIGHT 04/15/2024 05/05/24  10 cm proximal to olecranon process from proximal aspect of olecranon 30.1 30.3 31.2  Olecranon process 24.1 24.1 25  10  cm proximal to ulnar styloid process from proximal aspect of styloid process 21.7 22.2 21.9  Just distal to ulnar styloid process 15.4 15.3 15.6  Across hand at thumb web space 18.6 18.3   At base of 2nd digit 6.5 6.2 6.5  (Blank rows = not tested)   LANDMARK LEFT   eval  10 cm proximal to olecranon process from proximal aspect of olecranon 30.9   Olecranon process 23.8  10 cm proximal to ulnar styloid process from proximal aspect of styloid process 20.7  Just distal to  ulnar styloid process 15.6  Across hand at thumb web space 17.5  At base of 2nd digit 6.1  (Blank rows = not tested)  Surgery type/Date: 03/25/2024 Right lumpectomy with SLNB Number of lymph nodes removed: 3+/4 LN's, pending lymphadenectomy on 04/16/2024 Current/past treatment (chemo, radiation, hormone therapy):  Other symptoms:  Heaviness/tightness Yes Pain Yes Pitting edema No Infections No Decreased scar mobility Yes Stemmer sign No  TODAYS TREATMENT 05/23/2024 Overhead pulleys flexion and scaption x 2 min ea Finger ladder into flexion and abduction  x 4 each  PROM right shoulder flex, scaption, abd, ER MFR gently to multiple cords in axilla, less ttp today - normal pressure toelrated  05/21/2024 Overhead pulleys flexion and scaption x 2 min ea Discussed purchasing her sleeve; she will check at [facility] when she goes Friday Checked Right breast;generalized swelling greatest at lateral breast; several enlarged pores but not many In supine: Short neck, 5 diaphragmatic breaths, L axillary nodes and establishment of interaxillary pathway, R inguinal nodes and establishment of axilloinguinal pathway, then R breast moving fluid towards pathways spending extra time in any areas of fibrosis then retracing all steps. PT performed MLD for right breast swelling and educated pt while performing. PROM right shoulder flex, scaption, abd, ER Supine AROM bilateral flexion x 3, scaption x 3, horizontal abd x 4 Discussed video and gave pt sticky note with how to get to it from our website since she could not find it. Also wrote down name and size of sleeve so she can check at [facility] when she goes Friday, but also suggested she check [employer].  05/13/24: Therapeutic Exercises Pulleys into flex x 2 mins with VC's and demo to decrease Rt scapular compensation Roll yellow ball up wall into flex x 7 and stopped due to UE fatigue Manual Therapy P/ROM to Rt shoulder in supine into flex, abd  and D2 with scapular depression by therapist throughout MFR gently to multiple cords in axilla, pt currently very ttp   05/05/24 Lymphedivas size medium short pt will check online to see what she wants to order and has to self order due to no diagnosis of lymphedema.  Discussed post op instructions per below as well as scar massage review.  Iscussed POC and cording     PATIENT EDUCATION:  Education details: discussed and demonstrated scar massage to areolar incision but not to axillary incision until healed from surgery tomorrow, discussed prophylactic sleeve that we will help her get, SOZO screen to set up after next visit, gave video information if she wants to watch today, reviewed HEP and discussed importance taking to tightness only.. Advised to perform in a week after surgery or if she has drains, after they are removed. Scheduled follow up in 3 weeks Person educated: Patient Education method: Explanation, Demonstration, and Handouts Education comprehension: verbalized understanding  HOME EXERCISE PROGRAM: Reviewed previously given post op HEP.   ASSESSMENT:  CLINICAL IMPRESSION: Held MLD today due to doppler for concerns in the Left neck.  Continued ROM and work on the Rt arm cording.   Pt will benefit from skilled therapeutic intervention to improve on the following deficits: Decreased knowledge of precautions, impaired UE functional use, pain, decreased ROM, postural dysfunction.   PT treatment/interventions: ADL/Self care home management, [id number]- PT Re-evaluation, 97110-Therapeutic exercises, 97530- Therapeutic activity, [id number]- Neuromuscular re-education, 97535- Self Care, 02859- Manual therapy, and Patient/Family education   GOALS: Goals reviewed with patient? Yes  GOALS MET AT EVAL:  GOALS Name Target Date Goal status  1 Pt will be able to verbalize understanding of pertinent lymphedema risk reduction practices relevant to her dx specifically related to skin care.   Baseline:  No knowledge Eval Achieved at eval  2 Pt will be able to return demo and/or verbalize understanding of the post op HEP related to regaining shoulder ROM. Baseline:  No knowledge Eval Achieved at eval  3 Pt will be able to verbalize understanding of the importance of viewing the post op After Breast CA Class video for further lymphedema  risk reduction education and therapeutic exercise.  Baseline:  No knowledge Eval Achieved at eval   LONG TERM GOALS:  (STG=LTG)  GOALS Name Target Date  Goal status  1 Pt will demonstrate she has regained full shoulder ROM and function post operatively compared to baselines.  Baseline: After next surgery on 04/16/2024 06/02/2024 INITIAL  2 Pt will be fit for compression sleeve for prophylactic reasons 06/02/24 MET  3 Pt will have understanding of lymphedema precautions and will have all questions answered 06/02/24 ONGOING  4   INITIAL     PLAN:  PT FREQUENCY/DURATION: 2x per week x 4 weeks. Including therapeutic exercise, manual therapy, self care, therapeutic activities and re-eval.  Orthotic fit training as well.   PLAN FOR NEXT SESSION: set up SOZO screens, cont Rt AAROM/PROM and cording (gentle to start), watch Abc class?    [facility]  [address]  [place] [address]  [phone]    Larue Saddie SAUNDERS, PT 05/23/2024, 9:54 AM  "

## 2024-05-26 ENCOUNTER — Telehealth: Payer: Self-pay | Admitting: *Deleted

## 2024-05-26 ENCOUNTER — Ambulatory Visit

## 2024-05-26 NOTE — Telephone Encounter (Signed)
 [id number], ICE COMPRESS: RANDOMIZED TRIAL OF LIMB CRYOCOMPRESSION VERSUS CONTINUOUS COMPRESSION VERSUS LOW CYCLIC COMPRESSION FOR THE PREVENTION  OF TAXANE-INDUCED PERIPHERAL NEUROPATHY   Spoke with pt this morning regarding interest in above research study. Pt stated Dr.Gudena had spoken to pt about the trial and she was interested in it. Briefly described the 3 randomized arms, time on the machine, and patient requirement to pt. She asked if it would help with her lymphedema from surgery. Explained to pt that the device does provide different degrees of compression. Pt is agreeable for this nurse to come speak with her at her next infusion on 06/03/24 regarding the research trial and provide the consent form so she can read over it. Pt was thanked for her time and consideration.   Melanie Larsen, RN, BSN Clinical Research Nurse [phone] 05/26/2024

## 2024-05-27 ENCOUNTER — Other Ambulatory Visit: Payer: Self-pay

## 2024-05-27 ENCOUNTER — Telehealth: Payer: Self-pay

## 2024-05-27 NOTE — Telephone Encounter (Signed)
 Pt called w/ concern regarding intermittent headache she's had over the last few days. Per MD RN advised pt to increase fluids as headache may be treatment related. She also wanted to know if she could concurrently take her claritin and nasal spray and RN advised her that it was okay. RN also advised that if she has no resolution in sx by next week's visit to definitely mention it to MD at f/u. Pt verbalized understanding.

## 2024-05-28 ENCOUNTER — Ambulatory Visit: Admitting: Rehabilitation

## 2024-05-28 ENCOUNTER — Encounter: Payer: Self-pay | Admitting: Rehabilitation

## 2024-05-28 DIAGNOSIS — C50411 Malignant neoplasm of upper-outer quadrant of right female breast: Secondary | ICD-10-CM

## 2024-05-28 DIAGNOSIS — Z9189 Other specified personal risk factors, not elsewhere classified: Secondary | ICD-10-CM

## 2024-05-28 DIAGNOSIS — Z483 Aftercare following surgery for neoplasm: Secondary | ICD-10-CM

## 2024-05-28 DIAGNOSIS — R293 Abnormal posture: Secondary | ICD-10-CM

## 2024-05-28 DIAGNOSIS — M25611 Stiffness of right shoulder, not elsewhere classified: Secondary | ICD-10-CM

## 2024-05-28 NOTE — Therapy (Signed)
 " OUTPATIENT PHYSICAL THERAPY BREAST CANCER TREATMENT   Patient Name: Melanie Reese MRN: [id number] DOB:12-May-1956, 69 y.o., female Today's Date: 05/28/2024  END OF SESSION:  PT End of Session - 05/28/24 1151     Visit Number 7    Number of Visits 11    Date for Recertification  06/02/24    Authorization Type 9 visits 05/05/24-06/02/24    Authorization - Visit Number 7    Authorization - Number of Visits 9    PT Start Time 1103    PT Stop Time 1151    PT Time Calculation (min) 48 min    Activity Tolerance Patient tolerated treatment well    Behavior During Therapy WFL for tasks assessed/performed            Past Medical History:  Diagnosis Date   Anemia    Arthritis    Basal cell carcinoma    on face   Cancer (HCC)    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 Last seizure was 1983 per patient.   Past Surgical History:  Procedure Laterality Date   AXILLARY LYMPH NODE DISSECTION Right 04/16/2024   Procedure: REDGIE HARD;  Surgeon: Aron Shoulders, MD;  Location: [facility];  Service: General;  Laterality: Right;  RIGHT AXILLARY LYMPH NODE DISSECTION   BRAIN SURGERY     1975, 1976 plate in head and burrow from head injury   BREAST BIOPSY Right 02/22/2024   US  RT BREAST BX W LOC DEV 1ST LESION IMG BX SPEC US  GUIDE 02/22/2024 [facility]   BREAST LUMPECTOMY Right 03/25/2024   Procedure: BREAST LUMPECTOMY;  Surgeon: Aron Shoulders, MD;  Location: [facility];  Service: General;  Laterality: Right;   COLONOSCOPY     DRUG INDUCED ENDOSCOPY     PORTACATH PLACEMENT N/A 05/09/2024   Procedure: INSERTION, TUNNELED CENTRAL VENOUS DEVICE, WITH PORT;   Surgeon: Aron Shoulders, MD;  Location: [facility];  Service: General;  Laterality: N/A;  PORT PLACEMENT WITH ULTRASOUND GUIDANCE   SENTINEL NODE BIOPSY Right 03/25/2024   Procedure: BIOPSY, LYMPH NODE, SENTINEL;  Surgeon: Aron Shoulders, MD;  Location: [facility];  Service: General;  Laterality: Right;  GEN w/PEC BLOCK RIGHT BREAST LUMPECTOMY RIGHT SENTINEL LYMPH NODE BIOPSY   Patient Active Problem List   Diagnosis Date Noted   Malignant neoplasm of upper-outer quadrant of right breast in female, estrogen receptor positive (HCC) 03/11/2024   Metatarsalgia of both feet 03/20/2017   Knee pain, bilateral 03/20/2017    PCP: Montie Pizza, MD   REFERRING PROVIDER: Shoulders Aron, MD   REFERRING DIAG: C50.411,Z17.0 (ICD-10-CM) - Malignant neoplasm of upper-outer quadrant of right breast in female, estrogen receptor positive (HCC)     THERAPY DIAG:  Abnormal posture  Malignant neoplasm of upper-outer quadrant of right breast in female, estrogen receptor positive (HCC)  Aftercare following surgery for neoplasm  At risk for lymphedema  Stiffness of right shoulder, not elsewhere classified  Rationale for Evaluation and Treatment: Rehabilitation  ONSET DATE: 02/25/2024   SUBJECTIVE:  SUBJECTIVE STATEMENT: The breast has been feeling fine.  My back muscles have been tightening up.  I tried sleeping without a bra on and I slept better.  I still haven't gotten my sleeve.  We got our pulleys.    PERTINENT HISTORY:  Patient was diagnosed on 02/25/2024 with right grade 2 invasive ductal carcinoma. It measures 2.6 cm and is located in the upper-outer quadrant. It is ER/PR +, HER2 - with a Ki67 of 30%.  She is s/p a right lumpectomy with SLNB on 03/25/2024 with 3+/4 LN's.  ALND on 04/16/2024 with 2/29 nodes  positive.  Will be having chemotherapy.     PATIENT GOALS:  Reassess how my recovery is going related to arm function, pain, and swelling.  PAIN:  Are you having pain? The Rt side just feels weird and tight.    PRECAUTIONS: Recent Surgery, right UE Lymphedema risk,   RED FLAGS: None   ACTIVITY LEVEL / LEISURE: most home activities except vaccuming   OBJECTIVE:   PATIENT SURVEYS:   QUICK DASH: 45% from 34% last visit  OBSERVATIONS: Bruising noted all around areola present just last 2 days; see photo in media  POSTURE:  Forward head and rounded shoulders posture   LYMPHEDEMA ASSESSMENT:  UPPER EXTREMITY AROM/PROM:   A/PROM RIGHT   eval   RIGHT 04/15/2024 05/05/24 05/28/24  Shoulder extension 63 52 45 55  Shoulder flexion 152 153 108 130 (back to baseline with AAROM)  Shoulder abduction 145 148 90 - cording noted 125 (baseline with AAROM)  Shoulder internal rotation 54 67    Shoulder external rotation 75 102  80                          (Blank rows = not tested)   A/PROM LEFT   eval  Shoulder extension 52  Shoulder flexion 152  Shoulder abduction 156  Shoulder internal rotation 61  Shoulder external rotation 81                          (Blank rows = not tested)   CERVICAL AROM: All within normal limits:      Percent limited  Flexion WFL  Extension WFL  Right lateral flexion WFL  Left lateral flexion WFL  Right rotation WFL  Left rotation WFL      LYMPHEDEMA ASSESSMENTS (in cm):    LANDMARK RIGHT   eval RIGHT 04/15/2024 05/05/24  10 cm proximal to olecranon process from proximal aspect of olecranon 30.1 30.3 31.2  Olecranon process 24.1 24.1 25  10  cm proximal to ulnar styloid process from proximal aspect of styloid process 21.7 22.2 21.9  Just distal to ulnar styloid process 15.4 15.3 15.6  Across hand at thumb web space 18.6 18.3   At base of 2nd digit 6.5 6.2 6.5  (Blank rows = not tested)   LANDMARK LEFT   eval  10 cm proximal to olecranon  process from proximal aspect of olecranon 30.9   Olecranon process 23.8  10 cm proximal to ulnar styloid process from proximal aspect of styloid process 20.7  Just distal to ulnar styloid process 15.6  Across hand at thumb web space 17.5  At base of 2nd digit 6.1  (Blank rows = not tested)  Surgery type/Date: 03/25/2024 Right lumpectomy with SLNB Number of lymph nodes removed: 3+/4 LN's, pending lymphadenectomy on 04/16/2024 Current/past treatment (chemo, radiation, hormone therapy):  Other symptoms:  Heaviness/tightness  Yes Pain Yes Pitting edema No Infections No Decreased scar mobility Yes Stemmer sign No  TODAYS TREATMENT 05/28/24 Therapeutic Activity to improve reach  Pulleys into flex x 2 mins with VC's and demo to decrease Rt scapular compensation Roll yellow ball up wall into flex x 10 and abduction x 6 with initial demo Supine dowel flexion AAROM x 6 Then sitting up due to port discomfort  Switched to lawnchair position on table with more comfort.   90 shoulder circles c/cc x 10 each Manual Therapy P/ROM to Rt shoulder in supine into flex, abd and D2 with scapular depression by therapist throughout  05/23/2024 Overhead pulleys flexion and scaption x 2 min ea Finger ladder into flexion and abduction x 4 each  PROM right shoulder flex, scaption, abd, ER MFR gently to multiple cords in axilla, less ttp today - normal pressure toelrated  05/21/2024 Overhead pulleys flexion and scaption x 2 min ea Discussed purchasing her sleeve; she will check at [facility] when she goes Friday Checked Right breast;generalized swelling greatest at lateral breast; several enlarged pores but not many In supine: Short neck, 5 diaphragmatic breaths, L axillary nodes and establishment of interaxillary pathway, R inguinal nodes and establishment of axilloinguinal pathway, then R breast moving fluid towards pathways spending extra time in any areas of fibrosis then retracing all steps. PT  performed MLD for right breast swelling and educated pt while performing. PROM right shoulder flex, scaption, abd, ER Supine AROM bilateral flexion x 3, scaption x 3, horizontal abd x 4 Discussed video and gave pt sticky note with how to get to it from our website since she could not find it. Also wrote down name and size of sleeve so she can check at [facility] when she goes Friday, but also suggested she check [employer].  PATIENT EDUCATION:  Education details: discussed and demonstrated scar massage to areolar incision but not to axillary incision until healed from surgery tomorrow, discussed prophylactic sleeve that we will help her get, SOZO screen to set up after next visit, gave video information if she wants to watch today, reviewed HEP and discussed importance taking to tightness only.. Advised to perform in a week after surgery or if she has drains, after they are removed. Scheduled follow up in 3 weeks Person educated: Patient Education method: Explanation, Demonstration, and Handouts Education comprehension: verbalized understanding  HOME EXERCISE PROGRAM: Reviewed previously given post op HEP.   ASSESSMENT:  CLINICAL IMPRESSION: Pts AROM was checked today and she is making progress but is still not back to baseline AROM.  We discussed scheduling more appointments and that we could assess goals and submit a request officially on her next visit as she has some remaining on this auth.  Pt now has a compression bra, not yet a sleeve, and feels like the breast is not swollen or an issue but we will keep an eye on it.   Pt will benefit from skilled therapeutic intervention to improve on the following deficits: Decreased knowledge of precautions, impaired UE functional use, pain, decreased ROM, postural dysfunction.   PT treatment/interventions: ADL/Self care home management, [phone]- PT Re-evaluation, 97110-Therapeutic exercises, 97530- Therapeutic activity, [id number]- Neuromuscular  re-education, 97535- Self Care, [id number]- Manual therapy, and Patient/Family education   GOALS: Goals reviewed with patient? Yes  GOALS MET AT EVAL:  GOALS Name Target Date Goal status  1 Pt will be able to verbalize understanding of pertinent lymphedema risk reduction practices relevant to her dx specifically related to skin  care.  Baseline:  No knowledge Eval Achieved at eval  2 Pt will be able to return demo and/or verbalize understanding of the post op HEP related to regaining shoulder ROM. Baseline:  No knowledge Eval Achieved at eval  3 Pt will be able to verbalize understanding of the importance of viewing the post op After Breast CA Class video for further lymphedema risk reduction education and therapeutic exercise.  Baseline:  No knowledge Eval Achieved at eval   LONG TERM GOALS:  (STG=LTG)  GOALS Name Target Date  Goal status  1 Pt will demonstrate she has regained full shoulder ROM and function post operatively compared to baselines.  Baseline: After next surgery on 04/16/2024 06/02/2024 INITIAL  2 Pt will be fit for compression sleeve for prophylactic reasons 06/02/24 MET  3 Pt will have understanding of lymphedema precautions and will have all questions answered 06/02/24 ONGOING  4   INITIAL     PLAN:  PT FREQUENCY/DURATION: 2x per week x 4 weeks. Including therapeutic exercise, manual therapy, self care, therapeutic activities and re-eval.  Orthotic fit training as well.   PLAN FOR NEXT SESSION: set up SOZO screens, cont Rt AAROM/PROM and cording (gentle to start), watch Abc class?    [facility]  [address]  [place] [address]  [phone]    Larue Saddie SAUNDERS, PT 05/28/2024, 11:52 AM  "

## 2024-05-29 ENCOUNTER — Other Ambulatory Visit: Payer: Self-pay

## 2024-05-30 ENCOUNTER — Encounter: Payer: Self-pay | Admitting: Hematology and Oncology

## 2024-05-30 NOTE — Telephone Encounter (Signed)
 Pt reports worsening pain that started yesterday, describes pain from back of head, down spine, in sternum, hips, pelvis that started last night. Pt rates the pain as a 10 on a scale of 1-10. RN advised pt to use OTC pain meds and heat pad, however pain reports little relief having already done so. Pt reports that she has a Rx for oxy she can use. RN advised her to mention it to MD at f/u visit next Tuesday. Pt advised Pt also reports little red bumps that she noticed began on the 14th that are itchy and painful. She has been applying hydrocortisone and that has helped to manage it. RN advised that if the hives worsen to call us  back but otherwise to continue with the cortisone cream and call us  if they worsen. Pt verbalized understanding.

## 2024-06-02 ENCOUNTER — Ambulatory Visit

## 2024-06-02 ENCOUNTER — Telehealth: Payer: Self-pay | Admitting: *Deleted

## 2024-06-02 ENCOUNTER — Other Ambulatory Visit (HOSPITAL_COMMUNITY): Payer: Self-pay | Admitting: Physician Assistant

## 2024-06-02 DIAGNOSIS — R051 Acute cough: Secondary | ICD-10-CM

## 2024-06-02 DIAGNOSIS — C50411 Malignant neoplasm of upper-outer quadrant of right female breast: Secondary | ICD-10-CM

## 2024-06-02 MED ORDER — AMOXICILLIN-POT CLAVULANATE 875-125 MG PO TABS: 1.0000 | ORAL_TABLET | Freq: Two times a day (BID) | ORAL | 0 refills | Status: DC

## 2024-06-02 NOTE — Telephone Encounter (Signed)
 Received call from pt with complaint of cold like symptoms including nasal congestion, cough, periods of sweating and periods of chills.  Covid/flu test negative. Pt states she does not have a reliable oral thermometer at home and has taken her temperature on her forehead which read 99.0 and tympanic which read 103.0.  MD out of office and pt is scheduled to see PA tomorrow.  RN reviewed with PA and verbal orders received and placed for pt to undergo chest xray tomorrow prior to cancer center appts.  PA also states she has sent in a prescription for Augmentin  to pt pharmacy on file with instructions for pt to obtain reliable oral thermometer and start antibiotic if temperature is greater than 99.0.  Pt educated and verbalized understanding.

## 2024-06-03 ENCOUNTER — Inpatient Hospital Stay: Admitting: Physician Assistant

## 2024-06-03 ENCOUNTER — Inpatient Hospital Stay

## 2024-06-03 ENCOUNTER — Ambulatory Visit (HOSPITAL_COMMUNITY)
Admission: RE | Admit: 2024-06-03 | Discharge: 2024-06-03 | Disposition: A | Discharge status: Home / Self Care | Source: Ambulatory Visit | Attending: Physician Assistant | Admitting: Physician Assistant

## 2024-06-03 ENCOUNTER — Encounter: Payer: Self-pay | Admitting: *Deleted

## 2024-06-03 ENCOUNTER — Other Ambulatory Visit: Payer: Self-pay

## 2024-06-03 ENCOUNTER — Encounter: Payer: Self-pay | Admitting: Hematology and Oncology

## 2024-06-03 VITALS — BP 122/88 | HR 99 | Temp 98.0°F | Resp 16 | Wt 151.5 lb

## 2024-06-03 DIAGNOSIS — R051 Acute cough: Secondary | ICD-10-CM | POA: Insufficient documentation

## 2024-06-03 DIAGNOSIS — Z5111 Encounter for antineoplastic chemotherapy: Secondary | ICD-10-CM

## 2024-06-03 DIAGNOSIS — C50411 Malignant neoplasm of upper-outer quadrant of right female breast: Secondary | ICD-10-CM

## 2024-06-03 DIAGNOSIS — R0981 Nasal congestion: Secondary | ICD-10-CM

## 2024-06-03 DIAGNOSIS — Z170 Estrogen receptor positive status [ER+]: Secondary | ICD-10-CM | POA: Insufficient documentation

## 2024-06-03 LAB — CBC WITH DIFFERENTIAL (CANCER CENTER ONLY)
Abs Immature Granulocytes: 1.68 K/uL — ABNORMAL HIGH (ref 0.00–0.07)
Basophils Absolute: 0.2 K/uL — ABNORMAL HIGH (ref 0.0–0.1)
Basophils Relative: 1 %
Eosinophils Absolute: 0 K/uL (ref 0.0–0.5)
Eosinophils Relative: 0 %
HCT: 33 % — ABNORMAL LOW (ref 36.0–46.0)
Hemoglobin: 11 g/dL — ABNORMAL LOW (ref 12.0–15.0)
Immature Granulocytes: 10 %
Lymphocytes Relative: 5 %
Lymphs Abs: 0.8 K/uL (ref 0.7–4.0)
MCH: 29.7 pg (ref 26.0–34.0)
MCHC: 33.3 g/dL (ref 30.0–36.0)
MCV: 89.2 fL (ref 80.0–100.0)
Monocytes Absolute: 2.3 K/uL — ABNORMAL HIGH (ref 0.1–1.0)
Monocytes Relative: 13 %
Neutro Abs: 12.4 K/uL — ABNORMAL HIGH (ref 1.7–7.7)
Neutrophils Relative %: 71 %
Platelet Count: 278 K/uL (ref 150–400)
RBC: 3.7 MIL/uL — ABNORMAL LOW (ref 3.87–5.11)
RDW: 12.3 % (ref 11.5–15.5)
WBC Count: 17.4 K/uL — ABNORMAL HIGH (ref 4.0–10.5)
nRBC: 0 % (ref 0.0–0.2)

## 2024-06-03 LAB — CMP (CANCER CENTER ONLY)
ALT: 31 U/L (ref 0–44)
AST: 27 U/L (ref 15–41)
Albumin: 4.3 g/dL (ref 3.5–5.0)
Alkaline Phosphatase: 128 U/L — ABNORMAL HIGH (ref 38–126)
Anion gap: 14 (ref 5–15)
BUN: 12 mg/dL (ref 8–23)
CO2: 23 mmol/L (ref 22–32)
Calcium: 9.1 mg/dL (ref 8.9–10.3)
Chloride: 99 mmol/L (ref 98–111)
Creatinine: 0.8 mg/dL (ref 0.44–1.00)
GFR, Estimated: >60 mL/min
Glucose, Bld: 104 mg/dL — ABNORMAL HIGH (ref 70–99)
Potassium: 3.6 mmol/L (ref 3.5–5.1)
Sodium: 136 mmol/L (ref 135–145)
Total Bilirubin: <0.2 mg/dL (ref 0.0–1.2)
Total Protein: 7.4 g/dL (ref 6.5–8.1)

## 2024-06-03 MED ORDER — DOXORUBICIN HCL CHEMO IV INJECTION 2 MG/ML
60.0000 mg/m2 | Freq: Once | INTRAVENOUS | Status: AC
  Administered 2024-06-03: 104 mg via INTRAVENOUS
  Filled 2024-06-03: qty 52

## 2024-06-03 MED ORDER — PALONOSETRON HCL INJECTION 0.25 MG/5ML
0.2500 mg | Freq: Once | INTRAVENOUS | Status: AC
  Administered 2024-06-03: 0.25 mg via INTRAVENOUS
  Filled 2024-06-03: qty 5

## 2024-06-03 MED ORDER — ACETAMINOPHEN 500 MG PO TABS
500.0000 mg | ORAL_TABLET | Freq: Once | ORAL | Status: AC
  Administered 2024-06-03: 500 mg via ORAL
  Filled 2024-06-03: qty 1

## 2024-06-03 MED ORDER — APREPITANT 130 MG/18ML IV EMUL
130.0000 mg | Freq: Once | INTRAVENOUS | Status: AC
  Administered 2024-06-03: 130 mg via INTRAVENOUS
  Filled 2024-06-03: qty 18

## 2024-06-03 MED ORDER — DEXAMETHASONE SOD PHOSPHATE PF 10 MG/ML IJ SOLN
10.0000 mg | Freq: Once | INTRAMUSCULAR | Status: AC
  Administered 2024-06-03: 10 mg via INTRAVENOUS
  Filled 2024-06-03: qty 1

## 2024-06-03 MED ORDER — SODIUM CHLORIDE 0.9 % IV SOLN
600.0000 mg/m2 | Freq: Once | INTRAVENOUS | Status: AC
  Administered 2024-06-03: 1000 mg via INTRAVENOUS
  Filled 2024-06-03: qty 50

## 2024-06-03 MED ORDER — SODIUM CHLORIDE 0.9 % IV SOLN: INTRAVENOUS | Status: DC

## 2024-06-03 NOTE — Progress Notes (Unsigned)
 " Melanie Reese   [phone]  PROGRESS NOTE  Patient Care Team: Teresa Channel, MD as PCP - General (Family Medicine) Gerome, Devere HERO, RN as Registered Nurse Tyree Nanetta SAILOR, RN as Oncology Nurse Navigator Odean Potts, MD as Consulting Physician (Hematology and Oncology) Maritza Stagger, MD as Consulting Physician (Radiation Oncology) Aron Shoulders, MD as Consulting Physician (Surgical Oncology)   CHIEF COMPLAINTS/PURPOSE OF CONSULTATION:  Right  breast cancer  Oncology History  Melanie Reese, estrogen receptor positive (HCC)  02/22/2024 Initial Diagnosis   Patient had a palpable lump in the right breast measuring 2.6 cm by ultrasound biopsy revealed 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%, 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, axilla negative   03/12/2024 Cancer Staging   Staging form: Breast, AJCC 8th Edition - Clinical: Stage IB (cT2, cN0, cM0, G2, ER+, PR+, HER2-) - Signed by Odean Potts, MD on 03/12/2024 Stage prefix: Initial diagnosis Histologic grading system: 3 grade system   03/25/2024 Surgery   Right lumpectomy: 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, margins negative, 3/4 lymph nodes positive 04/16/2024: ALND: 2/29 lymph nodes positive (overall 5/33 lymph nodes)   04/29/2024 Cancer Staging   Staging form: Breast, AJCC 8th Edition - Pathologic stage from 04/29/2024: Stage IB (pT2, pN2, cM0, G2, ER+, PR+, HER2-) - Signed by Odean Potts, MD on 04/29/2024 Histologic grading system: 3 grade system   05/20/2024 -  Chemotherapy   Patient is on Treatment Plan : BREAST DOSE DENSE AC q14d / PACLitaxel q7d      CURRENT TREATMENT: Adjuvant AC chemotherapy  INTERVAL HISTORY:  Melanie Reese 69 y.o. Reese returns for a follow up for cycle 2, day 1 of AC chemotherapy. She is accompanied by her spouse for this visit.   On exam today, Melanie Reese reports having upper respiratory symptoms with nasal congestion  and.'s of sweating and chills yesterday. She started on Augmentin  therapy and has noticed an improvement of her symptoms.  She had a dry coughing spell earlier this morning which has resolved.  He denies nausea, vomiting or bowel habit changes.  She denies easy bruising or overt signs of bleeding.  She denies fevers, chills, night sweats, shortness of breath, chest pain, headaches or dizziness.  Rest of the 10 point ROS as below.  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 Last seizure was 1983 per patient.    SURGICAL HISTORY: Past Surgical History:  Procedure Laterality Date   AXILLARY LYMPH NODE DISSECTION Right 04/16/2024   Procedure: REDGIE HARD;  Surgeon: Aron Shoulders, MD;  Location: [facility] OR;  Service: General;  Laterality: Right;  RIGHT AXILLARY LYMPH NODE DISSECTION   BRAIN SURGERY     1975, 1976 plate in head and burrow from head injury   BREAST BIOPSY Right 02/22/2024   US  RT BREAST BX W LOC DEV 1ST LESION IMG BX SPEC US  GUIDE 02/22/2024 [facility] MAMMOGRAPHY  BREAST LUMPECTOMY Right 03/25/2024   Procedure: BREAST LUMPECTOMY;  Surgeon: Aron Shoulders, MD;  Location: [facility];  Service: General;  Laterality: Right;   COLONOSCOPY     DRUG INDUCED ENDOSCOPY     PORTACATH PLACEMENT N/A 05/09/2024   Procedure: INSERTION, TUNNELED CENTRAL VENOUS DEVICE, WITH PORT;  Surgeon: Aron Shoulders, MD;  Location: [facility] OR;  Service: General;  Laterality: N/A;  PORT PLACEMENT WITH ULTRASOUND GUIDANCE   SENTINEL NODE BIOPSY Right 03/25/2024    Procedure: BIOPSY, LYMPH NODE, SENTINEL;  Surgeon: Aron Shoulders, MD;  Location: [facility];  Service: General;  Laterality: Right;  GEN w/PEC BLOCK RIGHT BREAST LUMPECTOMY RIGHT SENTINEL LYMPH NODE BIOPSY    SOCIAL HISTORY: Social History   Socioeconomic History   Marital status: Single    Spouse name: Not on file   Number of children: Not on file   Years of education: Not on file   Highest education level: Not on file  Occupational History   Not on file  Tobacco Use   Smoking status: Never   Smokeless tobacco: Never  Vaping Use   Vaping status: Never Used  Substance and Sexual Activity   Alcohol use: Yes    Comment: rare   Drug use: Never   Sexual activity: Not Currently    Birth control/protection: Post-menopausal  Other Topics Concern   Not on file  Social History Narrative   Not on file   Social Drivers of Health   Tobacco Use: Low Risk (05/28/2024)   Patient History    Smoking Tobacco Use: Never    Smokeless Tobacco Use: Never    Passive Exposure: Not on file  Recent Concern: Tobacco Use - Medium Risk (05/19/2024)   Received from [facility]   Patient History    Smoking Tobacco Use: Former    Smokeless Tobacco Use: Never    Passive Exposure: Not on file  Financial Resource Strain: Low Risk (05/20/2024)   Overall Financial Resource Strain (CARDIA)    Difficulty of Paying Living Expenses: Not hard at all  Food Insecurity: No Food Insecurity (05/20/2024)   Epic    Worried About Radiation Protection Practitioner of Food in the Last Year: Never true    [employer] of Food in the Last Year: Never true  Transportation Needs: No Transportation Needs (05/20/2024)   Epic    Lack of Transportation (Medical): No    Lack of Transportation (Non-Medical): No  Physical Activity: Insufficiently Active (05/20/2024)   Exercise Vital Sign    Days of Exercise per Week: 7 days    Minutes of Exercise per Session: 20 min  Stress: No Stress Concern Present (05/20/2024)   [employer] - Occupational Stress Questionnaire    Feeling of Stress: Not at all  Social Connections: Unknown (05/20/2024)   Social Connection and Isolation Panel    Frequency of Communication with Friends and Family: Three times a week    Frequency of Social Gatherings with Friends and Family: Not on file    Attends Religious Services: Not on file    Active Member of Clubs or Organizations: Not on file    Attends Banker Meetings: Not on file    Marital Status: Not on file  Intimate Partner Violence: Not At Risk (05/20/2024)   Epic    Fear of Current or Ex-Partner: No    Emotionally Abused: No    Physically Abused: No    Sexually Abused: No  Depression (PHQ2-9): Low  Risk (05/20/2024)   Depression (PHQ2-9)    PHQ-2 Score: 0  Alcohol Screen: Low Risk (05/20/2024)   Alcohol Screen    Last Alcohol Screening Score (AUDIT): 0  Housing: Unknown (05/20/2024)   Epic    Unable to Pay for Housing in the Last Year: No    Number of Times Moved in the Last Year: Not on file    Homeless in the Last Year: No  Utilities: Not At Risk (05/20/2024)   Epic    Threatened with loss of utilities: No  Health Literacy: Adequate Health Literacy (05/20/2024)   B1300 Health Literacy    Frequency of need for help with medical instructions: Never    FAMILY HISTORY: Family History  Problem Relation Age of Onset   Ovarian cancer Maternal Aunt    Bone cancer Maternal Grandfather    Breast cancer Neg Hx     ALLERGIES:  is allergic to doxycycline, nsaids, chlorhexidine  gluconate, and sulfa antibiotics.  MEDICATIONS:  Current Outpatient Medications  Medication Sig Dispense Refill   acetaminophen  (TYLENOL ) 500 MG tablet Take 500 mg by mouth every 6 (six) hours as needed for moderate pain (pain score 4-6).     ALPRAZolam  (XANAX ) 0.25 MG tablet Take 1 tablet (0.25 mg total) by mouth daily as needed for anxiety. 30 tablet 0   amoxicillin -clavulanate (AUGMENTIN ) 875-125 MG tablet Take 1  tablet by mouth 2 (two) times daily. 10 tablet 0   Cholecalciferol 125 MCG (5000 UT) TABS Take 5,000 Units by mouth daily.     cyanocobalamin  2000 MCG tablet Take 2,000 mcg by mouth daily.     dexamethasone  (DECADRON ) 4 MG tablet Take 1 tablet day after chemo and 1 tablet 2 days after chemo with food 30 tablet 1   fluticasone (FLONASE) 50 MCG/ACT nasal spray Place 1 spray into both nostrils daily.     gabapentin  (NEURONTIN ) 100 MG capsule Take 100 mg by mouth 3 (three) times daily.     lidocaine -prilocaine  (EMLA ) cream Apply to affected area once 30 g 3   ondansetron  (ZOFRAN ) 8 MG tablet Take 1 tab (8 mg) by mouth every 8 hrs as needed for nausea/vomiting. Start third day after doxorubicin /cyclophosphamide  chemotherapy. 30 tablet 1   oxyCODONE  (OXY IR/ROXICODONE ) 5 MG immediate release tablet Take 1 tablet (5 mg total) by mouth every 6 (six) hours as needed for severe pain (pain score 7-10). 5 tablet 0   PHENobarbital  (LUMINAL) 97.2 MG tablet Take 97.2 mg by mouth at bedtime.     prochlorperazine  (COMPAZINE ) 10 MG tablet Take 1 tablet (10 mg total) by mouth every 6 (six) hours as needed for nausea or vomiting. 30 tablet 1   tiZANidine (ZANAFLEX) 2 MG tablet Take 2 mg by mouth 3 (three) times daily.     triamterene-hydrochlorothiazide (MAXZIDE-25) 37.5-25 MG tablet Take 1 tablet by mouth daily as needed (swelling).     No current facility-administered medications for this visit.   Facility-Administered Medications Ordered in Other Visits  Medication Dose Route Frequency Provider Last Rate Last Admin   0.9 %  sodium chloride  infusion   Intravenous Continuous Gudena, Vinay, MD 10 mL/hr at 06/03/24 1106 New Bag at 06/03/24 1106   cyclophosphamide  (CYTOXAN ) 1,000 mg in sodium chloride  0.9 % 250 mL chemo infusion  600 mg/m2 (Treatment Plan Recorded) Intravenous Once Gudena, Vinay, MD       DOXOrubicin  (ADRIAMYCIN ) chemo injection 104 mg  60 mg/m2 (Treatment Plan Recorded) Intravenous Once Gudena,  Vinay, MD  REVIEW OF SYSTEMS:   Constitutional: ( - ) fevers, ( - )  chills , ( - ) night sweats Eyes: ( - ) blurriness of vision, ( - ) double vision, ( - ) watery eyes Ears, nose, mouth, throat, and face: ( - ) mucositis, ( - ) sore throat Respiratory: ( - ) cough, ( - ) dyspnea, ( - ) wheezes Cardiovascular: ( - ) palpitation, ( - ) chest discomfort, ( - ) lower extremity swelling Gastrointestinal:  ( - ) nausea, ( - ) heartburn, ( - ) change in bowel habits Skin: ( - ) abnormal skin rashes Lymphatics: ( - ) new lymphadenopathy, ( - ) easy bruising Neurological: ( - ) numbness, ( - ) tingling, ( - ) new weaknesses Behavioral/Psych: ( - ) mood change, ( - ) new changes  All other systems were reviewed with the patient and are negative.  PHYSICAL EXAMINATION: ECOG PERFORMANCE STATUS: 1 - Symptomatic but completely ambulatory  Vitals:   06/03/24 0958  BP: 122/88  Pulse: 99  Resp: 16  Temp: 98 F (36.7 C)  SpO2: 99%   Filed Weights   06/03/24 0958  Weight: 151 lb 8 oz (68.7 kg)    GENERAL: well appearing Reese in NAD  SKIN: skin color, texture, turgor are normal, no rashes or significant lesions EYES: conjunctiva are pink and non-injected, sclera clear OROPHARYNX: no exudate, no erythema; lips, buccal mucosa, and tongue normal  NECK: supple, non-tender LUNGS: clear to auscultation and percussion with normal breathing effort HEART: regular rate & rhythm and no murmurs and no lower extremity edema Musculoskeletal: no cyanosis of digits and no clubbing  PSYCH: alert & oriented x 3, fluent speech NEURO: no focal motor/sensory deficits  LABORATORY DATA:  I have reviewed the data as listed    Latest Ref Rng & Units 06/03/2024    9:34 AM 05/20/2024   11:19 AM 04/16/2024    9:47 AM  CBC  WBC 4.0 - 10.5 K/uL 17.4  5.2  4.1   Hemoglobin 12.0 - 15.0 g/dL 88.9  87.7  87.5   Hematocrit 36.0 - 46.0 % 33.0  37.1  40.1   Platelets 150 - 400 K/uL 278  249  281         Latest Ref Rng & Units 06/03/2024    9:34 AM 05/20/2024   11:19 AM 05/05/2024    3:00 PM  CMP  Glucose 70 - 99 mg/dL 895  877  894   BUN 8 - 23 mg/dL 12  11  12    Creatinine 0.44 - 1.00 mg/dL 9.19  9.20  9.16   Sodium 135 - 145 mmol/L 136  136  139   Potassium 3.5 - 5.1 mmol/L 3.6  4.1  4.3   Chloride 98 - 111 mmol/L 99  103  103   CO2 22 - 32 mmol/L 23  25  27    Calcium 8.9 - 10.3 mg/dL 9.1  9.0  9.2   Total Protein 6.5 - 8.1 g/dL 7.4  6.6    Total Bilirubin 0.0 - 1.2 mg/dL <9.7  0.2    Alkaline Phos 38 - 126 U/L 128  92    AST 15 - 41 U/L 27  24    ALT 0 - 44 U/L 31  15      RADIOGRAPHIC STUDIES: I have personally reviewed the radiological images as listed and agreed with the findings in the report. DG Chest 2 View Result Date: 06/03/2024 CLINICAL DATA:  Cough,  congestion and fever 2 days. Undergoing chemotherapy for breast cancer. EXAM: CHEST - 2 VIEW COMPARISON:  05/09/2024 FINDINGS: Left subclavian Port-A-Cath unchanged. Lungs are adequately inflated and otherwise clear. Cardiomediastinal silhouette and remainder of the exam is unchanged. IMPRESSION: No active cardiopulmonary disease. Electronically Signed   By: Toribio Agreste M.D.   On: 06/03/2024 09:18   VAS US  UPPER EXTREMITY VENOUS DUPLEX Result Date: 05/22/2024 UPPER VENOUS STUDY  Patient Name:  SNIGDHA HOWSER  Date of Exam:   05/22/2024 Medical Rec #: [id number]       Accession #:    [id number] Date of Birth: Oct 05, 1955        Patient Gender: F Patient Age:   69 years Exam Location:  [facility] Procedure:      VAS US  UPPER EXTREMITY VENOUS DUPLEX Referring Phys: MACKEY CHAD --------------------------------------------------------------------------------  Indications: Swelling Risk Factors: Cancer. Comparison Study: No prior studies. Performing Technologist: Cordella Collet RVT  Examination Guidelines: A complete evaluation includes B-mode imaging, spectral Doppler, color Doppler, and power Doppler as needed of all accessible  portions of each vessel. Bilateral testing is considered an integral part of a complete examination. Limited examinations for reoccurring indications may be performed as noted.  Right Findings: +----------+------------+---------+-----------+----------+-------+ RIGHT     CompressiblePhasicitySpontaneousPropertiesSummary +----------+------------+---------+-----------+----------+-------+ Subclavian               Yes       Yes                      +----------+------------+---------+-----------+----------+-------+  Left Findings: +----------+------------+---------+-----------+----------+-------+ LEFT      CompressiblePhasicitySpontaneousPropertiesSummary +----------+------------+---------+-----------+----------+-------+ IJV           Full       Yes       Yes                      +----------+------------+---------+-----------+----------+-------+ Subclavian    Full       Yes       Yes                      +----------+------------+---------+-----------+----------+-------+ Axillary      Full       Yes       Yes                      +----------+------------+---------+-----------+----------+-------+ Brachial      Full                                          +----------+------------+---------+-----------+----------+-------+ Radial        Full                                          +----------+------------+---------+-----------+----------+-------+ Ulnar         Full                                          +----------+------------+---------+-----------+----------+-------+ Cephalic      Full                                          +----------+------------+---------+-----------+----------+-------+  Basilic       Full                                          +----------+------------+---------+-----------+----------+-------+  Summary:  Right: No evidence of thrombosis in the subclavian.  Left: No evidence of deep vein thrombosis in the upper extremity.  No evidence of superficial vein thrombosis in the upper extremity.  *See table(s) above for measurements and observations.  Diagnosing physician: Debby Robertson Electronically signed by Debby Robertson on 05/22/2024 at 8:19:16 PM.    Final    DG CHEST PORT 1 VIEW Result Date: 05/09/2024 EXAM: 1 VIEW(S) XRAY OF THE CHEST 05/09/2024 11:03:04 AM COMPARISON: Restaging CT chest, abdomen, and pelvis 05/02/2024. CLINICAL HISTORY: 69 year old 41-month-old Reese. Breast Cancer. Port-A-Cath in place. FINDINGS: LINES, TUBES AND DEVICES: Left subclavian Port-A-Cath with distal tip in SVC. LUNGS AND PLEURA: Left base atelectasis. Trace left pleural effusion. No pneumothorax. HEART AND MEDIASTINUM: No acute abnormality of the cardiac and mediastinal silhouettes. BONES AND SOFT TISSUES: Right chest wall vascular clips noted. No acute osseous abnormality. IMPRESSION: 1. Left chest power port tip projects in the SVC. 2. Left basilar atelectasis, trace left pleural effusion. Electronically signed by: Helayne Hurst MD 05/09/2024 11:42 AM EST RP Workstation: [id number]   DG C-Arm 1-60 Min-No Report Result Date: 05/09/2024 Fluoroscopy was utilized by the requesting physician.  No radiographic interpretation.   ECHOCARDIOGRAM COMPLETE Result Date: 05/08/2024    ECHOCARDIOGRAM REPORT   Patient Name:   SHARIKA MOSQUERA Date of Exam: 05/07/2024 Medical Rec #:  [id number]      Height:       60.0 in Accession #:    [id number]     Weight:       151.9 lb Date of Birth:  [birth year]       BSA:          1.661 m Patient Age:    68 years       BP:           145/83 mmHg Patient Gender: F              HR:           67 bpm. Exam Location:  Inpatient Procedure: 2D Echo, Color Doppler, Cardiac Doppler and Strain Analysis (Both            Spectral and Color Flow Doppler were utilized during procedure). Indications:    Chemo Z09  History:        Patient has no prior history of Echocardiogram examinations.  Sonographer:    Merlynn Argyle Referring Phys:  [id number] MACKEY CHAD IMPRESSIONS  1. Left ventricular ejection fraction, by estimation, is 55 to 60%. The left ventricle has normal function. The left ventricle has no regional wall motion abnormalities. Left ventricular diastolic parameters were normal. The average left ventricular global longitudinal strain is -20.4 %. The global longitudinal strain is normal.  2. Right ventricular systolic function is normal. The right ventricular size is normal. Tricuspid regurgitation signal is inadequate for assessing PA pressure.  3. The mitral valve is normal in structure. Trivial mitral valve regurgitation. No evidence of mitral stenosis.  4. The aortic valve is tricuspid. Aortic valve regurgitation is not visualized. No aortic stenosis is present.  5. The inferior vena cava is normal in size with greater than 50% respiratory variability, suggesting right atrial pressure of 3 mmHg. FINDINGS  Left Ventricle: Left ventricular ejection fraction, by estimation, is 55 to 60%. The left ventricle has normal function. The left ventricle has no regional wall motion abnormalities. The average left ventricular global longitudinal strain is -20.4 %. Strain was performed and the global longitudinal strain is normal. The left ventricular internal cavity size was normal in size. There is no left ventricular hypertrophy. Left ventricular diastolic parameters were normal. Right Ventricle: The right ventricular size is normal. No increase in right ventricular wall thickness. Right ventricular systolic function is normal. Tricuspid regurgitation signal is inadequate for assessing PA pressure. Left Atrium: Left atrial size was normal in size. Right Atrium: Right atrial size was normal in size. Pericardium: There is no evidence of pericardial effusion. Mitral Valve: The mitral valve is normal in structure. Trivial mitral valve regurgitation. No evidence of mitral valve stenosis. Tricuspid Valve: The tricuspid valve is normal in structure.  Tricuspid valve regurgitation is trivial. No evidence of tricuspid stenosis. Aortic Valve: The aortic valve is tricuspid. Aortic valve regurgitation is not visualized. No aortic stenosis is present. Pulmonic Valve: The pulmonic valve was normal in structure. Pulmonic valve regurgitation is not visualized. No evidence of pulmonic stenosis. Aorta: The aortic root is normal in size and structure. Ascending aorta measurements are within normal limits for age when indexed to body surface area. Venous: The inferior vena cava is normal in size with greater than 50% respiratory variability, suggesting right atrial pressure of 3 mmHg. IAS/Shunts: No atrial level shunt detected by color flow Doppler. Additional Comments: 3D was performed not requiring image post processing on an independent workstation and was normal.  LEFT VENTRICLE PLAX 2D LVIDd:         3.80 cm   Diastology LVIDs:         2.60 cm   LV e' medial:    7.72 cm/s LV PW:         0.80 cm   LV E/e' medial:  9.3 LV IVS:        0.90 cm   LV e' lateral:   9.57 cm/s LVOT diam:     1.80 cm   LV E/e' lateral: 7.5 LV SV:         59 LV SV Index:   35        2D Longitudinal Strain LVOT Area:     2.Reese cm  2D Strain GLS Avg:     -20.4 %  RIGHT VENTRICLE             IVC RV Basal diam:  3.30 cm     IVC diam: 1.90 cm RV S prime:     10.70 cm/s LEFT ATRIUM             Index        RIGHT ATRIUM           Index LA diam:        3.10 cm 1.87 cm/m   RA Area:     14.50 cm LA Vol (A2C):   47.5 ml 28.60 ml/m  RA Volume:   35.30 ml  21.26 ml/m LA Vol (A4C):   33.8 ml 20.35 ml/m LA Biplane Vol: 40.5 ml 24.39 ml/m  AORTIC VALVE LVOT Vmax:   95.60 cm/s LVOT Vmean:  72.400 cm/s LVOT VTI:    0.231 m  AORTA Ao Root diam: 3.40 cm Ao Asc diam:  3.70 cm MITRAL VALVE MV Area (PHT): 3.72 cm     SHUNTS MV Decel Time: 204 msec  Systemic VTI:  0.23 m MV E velocity: 72.00 cm/s   Systemic Diam: 1.80 cm MV A velocity: 103.00 cm/s MV E/A ratio:  0.70 Soyla Merck MD Electronically signed by  Soyla Merck MD Signature Date/Time: 05/08/2024/6:37:46 PM    Final     ASSESSMENT & PLAN CREE NAPOLI is a 68 y.o. Reese who presents to the clinic for right breast cancer.   #Right breast cancer: --02/22/2024:Patient had a palpable lump in the right breast measuring 2.6 cm by ultrasound biopsy revealed 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%, 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, axilla negative  --03/25/2024: Right lumpectomy: 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, margins negative, 3/4 lymph nodes positive --04/16/2024: ALND: 2/29 lymph nodes positive (overall 5/33 lymph nodes) --Treatment recommendations include:  Adjuvant chemotherapy with dose dense Adriamycin  and Cytoxan  followed by Taxol; Adjuvant radiation therapy; Adjuvant antiestrogen therapy.  --Started AC chemotherapy on 05/20/2024 PLAN: --Due for cycle 2, day 1 today --Labs from today were reviewed and adequate for treatment. WBC 17.4, Hgb 11.0, Plt 278, creatinine and LFTS are normal --Proceed with treatment today without any dose modifications --RTC in 2 weeks with labs and follow up prior to Cycle 3, Day 1.    #URI Symptoms: --Chest xray showed no acute infectious process.  --Complete Augmentin  therapy and other OTC measures including daily antihistamine.  --Encouraged cool mist humidifier and staying hydrated. --Advised to monitor closely for fever.    No orders of the defined types were placed in this encounter.   All questions were answered. The patient knows to call the clinic with any problems, questions or concerns.  I have spent a total of 30 minutes minutes of face-to-face and non-face-to-face time, preparing to see the patient, performing a medically appropriate examination, counseling and educating the patient, documenting clinical information in the electronic health record, independently interpreting results and communicating results to the patient, and care coordination.   Melanie Police, PA-C Department of Hematology/Oncology [facility] Phone: [phone] "

## 2024-06-03 NOTE — Research (Signed)
 [id number], ICE COMPRESS: RANDOMIZED TRIAL OF LIMB CRYOCOMPRESSION VERSUS CONTINUOUS COMPRESSION VERSUS LOW CYCLIC COMPRESSION FOR THE PREVENTION  OF TAXANE-INDUCED PERIPHERAL NEUROPATHY    Patient Melanie Reese was identified by Dr.Gudena as a potential candidate for the above listed study.  This Clinical Research Nurse met with Melanie Reese, [id number], on 06/03/24 in a manner and location that ensures patient privacy to discuss participation in the above listed research study.  Patient is Unaccompanied.  A copy of the informed consent document and separate HIPAA Authorization was provided to the patient.  Patient reads, speaks, and understands English.   Patient was provided with the business card of this Nurse and encouraged to contact the research team with any questions.  Approximately 10 minutes were spent with the patient reviewing the informed consent documents.  Patient was provided the option of taking informed consent documents home to review and was encouraged to review at their convenience with their support network, including other care providers. Patient took the consent documents home to review. A copy of What to Expect brochure was given and reviewed with pt. Pt was informed that her participation is optional. The 3 arms of the study was discussed with the pt. Informed her she will be randomized to one of the 3 arms if she decides to participate in the study.  Pt verbalized understanding and did not have any further questions at this time. Pt was encouraged to read all documents given thoroughly and we will reach out to pt once it gets closer to her Taxol infusion. The pt can also reach out to clinical research if she has any questions.   Mazie Larsen, RN, BSN Clinical Research Nurse [phone] 06/03/2024

## 2024-06-03 NOTE — Patient Instructions (Signed)
 [facility] - A DEPT OF [facility]  Discharge Instructions: Thank you for choosing [facility] to provide your oncology and hematology care.   If you have a lab appointment with the Cancer Center, please go directly to the Cancer Center and check in at the registration area.   Wear comfortable clothing and clothing appropriate for easy access to any Portacath or PICC line.   We strive to give you quality time with your provider. You may need to reschedule your appointment if you arrive late (15 or more minutes).  Arriving late affects you and other patients whose appointments are after yours.  Also, if you miss three or more appointments without notifying the office, you may be dismissed from the clinic at the provider's discretion.      For prescription refill requests, have your pharmacy contact our office and allow 72 hours for refills to be completed.    Today you received the following chemotherapy and/or immunotherapy agents: Doxorubicin  (Adriamycin ) & Cyclophosphamide  (Cytoxan )     To help prevent nausea and vomiting after your treatment, we encourage you to take your nausea medication as directed.  BELOW ARE SYMPTOMS THAT SHOULD BE REPORTED IMMEDIATELY: *FEVER GREATER THAN 100.4 F (38 C) OR HIGHER *CHILLS OR SWEATING *NAUSEA AND VOMITING THAT IS NOT CONTROLLED WITH YOUR NAUSEA MEDICATION *UNUSUAL SHORTNESS OF BREATH *UNUSUAL BRUISING OR BLEEDING *URINARY PROBLEMS (pain or burning when urinating, or frequent urination) *BOWEL PROBLEMS (unusual diarrhea, constipation, pain near the anus) TENDERNESS IN MOUTH AND THROAT WITH OR WITHOUT PRESENCE OF ULCERS (sore throat, sores in mouth, or a toothache) UNUSUAL RASH, SWELLING OR PAIN  UNUSUAL VAGINAL DISCHARGE OR ITCHING   Items with * indicate a potential emergency and should be followed up as soon as possible or go to the Emergency Department if any problems should occur.  Please show  the CHEMOTHERAPY ALERT CARD or IMMUNOTHERAPY ALERT CARD at check-in to the Emergency Department and triage nurse.  Should you have questions after your visit or need to cancel or reschedule your appointment, please contact [facility] - A DEPT OF [facility]  Dept: [phone]  and follow the prompts.  Office hours are 8:00 a.m. to 4:30 p.m. Monday - Friday. Please note that voicemails left after 4:00 p.m. may not be returned until the following business day.  We are closed weekends and major holidays. You have access to a nurse at all times for urgent questions. Please call the main number to the clinic Dept: [phone] and follow the prompts.   For any non-urgent questions, you may also contact your provider using MyChart. We now offer e-Visits for anyone 110 and older to request care online for non-urgent symptoms. For details visit [email or web address].   Also download the MyChart app! Go to the app store, search MyChart, open the app, select [facility], and log in with your MyChart username and password.

## 2024-06-04 ENCOUNTER — Encounter: Payer: Self-pay | Admitting: Hematology and Oncology

## 2024-06-04 ENCOUNTER — Encounter: Payer: Self-pay | Admitting: Genetic Counselor

## 2024-06-05 ENCOUNTER — Encounter: Payer: Self-pay | Admitting: Genetic Counselor

## 2024-06-05 ENCOUNTER — Inpatient Hospital Stay

## 2024-06-05 ENCOUNTER — Other Ambulatory Visit: Payer: Self-pay | Admitting: Genetic Counselor

## 2024-06-05 ENCOUNTER — Inpatient Hospital Stay: Admitting: Genetic Counselor

## 2024-06-05 VITALS — BP 113/74 | HR 74 | Temp 97.8°F | Resp 16

## 2024-06-05 DIAGNOSIS — Z8041 Family history of malignant neoplasm of ovary: Secondary | ICD-10-CM | POA: Insufficient documentation

## 2024-06-05 DIAGNOSIS — C50411 Malignant neoplasm of upper-outer quadrant of right female breast: Secondary | ICD-10-CM

## 2024-06-05 LAB — GENETIC SCREENING ORDER

## 2024-06-05 MED ORDER — PEGFILGRASTIM-CBQV 6 MG/0.6ML ~~LOC~~ SOSY
6.0000 mg | PREFILLED_SYRINGE | Freq: Once | SUBCUTANEOUS | Status: AC
  Administered 2024-06-05: 6 mg via SUBCUTANEOUS
  Filled 2024-06-05: qty 0.6

## 2024-06-05 NOTE — Progress Notes (Signed)
 REFERRING PROVIDER: Aron Shoulders, MD [address] [place],  [place] [address]  PRIMARY PROVIDER:  Teresa Channel, MD  PRIMARY REASON FOR VISIT:  1. Family history of ovarian cancer   2. Malignant neoplasm of upper-outer quadrant of right breast in female, estrogen receptor positive (HCC)      HISTORY OF PRESENT ILLNESS:   Ms. Wadas, a 69 y.o. female, was seen for a [place] cancer genetics consultation at the request of Dr. Aron due to a personal and family history of cancer.  Ms. Botkin presents to clinic today to discuss the possibility of a hereditary predisposition to cancer, genetic testing, and to further clarify her future cancer risks, as well as potential cancer risks for family members.   In October 2025, at the age of 46, Ms. Tilson was diagnosed with breast cancer.     CANCER HISTORY:  Oncology History  Malignant neoplasm of upper-outer quadrant of right breast in female, estrogen receptor positive (HCC)  02/22/2024 Initial Diagnosis   Patient had a palpable lump in the right breast measuring 2.6 cm by ultrasound biopsy revealed 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%, 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, axilla negative   03/12/2024 Cancer Staging   Staging form: Breast, AJCC 8th Edition - Clinical: Stage IB (cT2, cN0, cM0, G2, ER+, PR+, HER2-) - Signed by Odean Potts, MD on 03/12/2024 Stage prefix: Initial diagnosis Histologic grading system: 3 grade system   03/25/2024 Surgery   Right lumpectomy: 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, margins negative, 3/4 lymph nodes positive 04/16/2024: ALND: 2/29 lymph nodes positive (overall 5/33 lymph nodes)   04/29/2024 Cancer Staging   Staging form: Breast, AJCC 8th Edition - Pathologic stage from 04/29/2024: Stage IB (pT2, pN2, cM0, G2, ER+, PR+, HER2-) - Signed by Odean Potts, MD on 04/29/2024 Histologic grading system: 3 grade system   05/20/2024 -  Chemotherapy   Patient is on Treatment Plan : BREAST DOSE DENSE AC q14d /  PACLitaxel q7d        RISK FACTORS:  Menarche was at age 16.  First live birth at age 72.  OCP use for approximately 10 years.  Ovaries intact: yes.  Hysterectomy: no.  Menopausal status: postmenopausal.  HRT use: 0 years. Colonoscopy: yes; normal. Mammogram within the last year: yes. Number of breast biopsies: 1. Up to date with pelvic exams: n/a. 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 Last seizure was 1983 per patient.    Past Surgical History:  Procedure Laterality Date   AXILLARY LYMPH NODE DISSECTION Right 04/16/2024   Procedure: REDGIE HARD;  Surgeon: Aron Shoulders, MD;  Location: [facility] OR;  Service: General;  Laterality: Right;  RIGHT AXILLARY LYMPH NODE DISSECTION   BRAIN SURGERY     1975, 1976 plate in head and burrow from head injury   BREAST BIOPSY Right 02/22/2024   US   RT BREAST BX W LOC DEV 1ST LESION IMG BX SPEC US  GUIDE 02/22/2024 [facility] MAMMOGRAPHY   BREAST LUMPECTOMY Right 03/25/2024   Procedure: BREAST LUMPECTOMY;  Surgeon: Aron Shoulders, MD;  Location: [facility];  Service: General;  Laterality: Right;   COLONOSCOPY     DRUG INDUCED ENDOSCOPY     PORTACATH PLACEMENT N/A 05/09/2024   Procedure: INSERTION, TUNNELED CENTRAL VENOUS DEVICE, WITH PORT;  Surgeon: Aron Shoulders, MD;  Location: [facility] OR;  Service: General;  Laterality: N/A;  PORT PLACEMENT WITH ULTRASOUND GUIDANCE   SENTINEL NODE BIOPSY Right 03/25/2024   Procedure: BIOPSY,  LYMPH NODE, SENTINEL;  Surgeon: Aron Shoulders, MD;  Location: [facility];  Service: General;  Laterality: Right;  GEN w/PEC BLOCK RIGHT BREAST LUMPECTOMY RIGHT SENTINEL LYMPH NODE BIOPSY    Social History   Socioeconomic History   Marital status: Single    Spouse name: Not on file   Number of children: Not on file   Years of education: Not on file   Highest education level: Not on file  Occupational History   Not on file  Tobacco Use   Smoking status: Never   Smokeless tobacco: Never  Vaping Use   Vaping status: Never Used  Substance and Sexual Activity   Alcohol use: Yes    Comment: rare   Drug use: Never   Sexual activity: Not Currently    Birth control/protection: Post-menopausal  Other Topics Concern   Not on file  Social History Narrative   Not on file   Social Drivers of Health   Tobacco Use: Low Risk (05/28/2024)   Patient History    Smoking Tobacco Use: Never    Smokeless Tobacco Use: Never    Passive Exposure: Not on file  Recent Concern: Tobacco Use - Medium Risk (05/19/2024)   Received [facility]   Patient History    Smoking Tobacco Use: Former    Smokeless Tobacco Use: Never    Passive Exposure: Not on file  Financial Resource Strain: Low Risk (05/20/2024)   Overall Financial Resource Strain (CARDIA)    Difficulty of Paying Living Expenses: Not hard at all  Food Insecurity: No Food Insecurity (05/20/2024)   Epic    Worried About Radiation Protection Practitioner of Food in the Last Year: Never true    [employer] of Food in the Last Year: Never true  Transportation Needs: No Transportation Needs (05/20/2024)   Epic    Lack of Transportation (Medical): No    Lack of Transportation (Non-Medical): No  Physical Activity: Insufficiently Active (05/20/2024)   Exercise Vital Sign    Days of Exercise per Week: 7 days    Minutes of Exercise per Session: 20 min  Stress: No Stress Concern Present (05/20/2024)   [employer] of Occupational Health -  Occupational Stress Questionnaire    Feeling of Stress: Not at all  Social Connections: Unknown (05/20/2024)   Social Connection and Isolation Panel    Frequency of Communication with Friends and Family: Three times a week    Frequency of Social Gatherings with Friends and Family: Not on file    Attends Religious Services: Not on file    Active Member of Clubs or Organizations: Not on file    Attends Banker Meetings: Not on file    Marital Status: Not on file  Depression (PHQ2-9): Low Risk (05/20/2024)   Depression (PHQ2-9)    PHQ-2 Score: 0  Alcohol Screen: Low Risk (05/20/2024)   Alcohol Screen  Last Alcohol Screening Score (AUDIT): 0  Housing: Unknown (05/20/2024)   Epic    Unable to Pay for Housing in the Last Year: No    Number of Times Moved in the Last Year: Not on file    Homeless in the Last Year: No  Utilities: Not At Risk (05/20/2024)   Epic    Threatened with loss of utilities: No  Health Literacy: Adequate Health Literacy (05/20/2024)   B1300 Health Literacy    Frequency of need for help with medical instructions: Never     FAMILY HISTORY:  We obtained a detailed, 4-generation family history.  Significant diagnoses are listed below: Family History  Problem Relation Age of Onset   Skin cancer Father    Ovarian cancer Maternal Aunt    Skin cancer Maternal Uncle    Lung cancer Paternal Aunt    Heart attack Paternal Uncle    Bone cancer Maternal Grandfather    Breast cancer Neg Hx      Significant family history reported includes: Father with skin cancer Mother with precancerous lump in breast Maternal aunt with ovarian cancer Maternal uncle with skin cancer Maternal grandfather with bone cancer  Ms. Hockman is unaware of previous family history of genetic testing for hereditary cancer risks.  There is no reported Ashkenazi Jewish ancestry. There is no known consanguinity.  GENETIC COUNSELING ASSESSMENT: Ms. Zill is a 69 y.o. female with a personal  and family history of cancer which is somewhat suggestive of a hereditary cancer syndrome and predisposition to cancer given the combination of cancer in the family. We, therefore, discussed and recommended the following at today's visit.   DISCUSSION: We discussed that, in general, most cancer is not inherited in families, but instead is sporadic or familial. Sporadic cancers occur by chance and typically happen at older ages (>50 years) as this type of cancer is caused by genetic changes acquired during an individuals lifetime. Some families have more cancers than would be expected by chance; however, the ages or types of cancer are not consistent with a known genetic mutation or known genetic mutations have been ruled out. This type of familial cancer is thought to be due to a combination of multiple genetic, environmental, hormonal, and lifestyle factors. While this combination of factors likely increases the risk of cancer, the exact source of this risk is not currently identifiable or testable.  We discussed that 5 - 10% of breast cancer is hereditary, with most cases associated with BRCA mutations.  There are other genes that can be associated with hereditary breast cancer syndromes.  These include ATM, CHEK2 and PALB2.  We discussed that testing is beneficial for several reasons including knowing how to follow individuals after completing their treatment, identifying whether potential treatment options such as PARP inhibitors would be beneficial, and understand if other family members could be at risk for cancer and allow them to undergo genetic testing.   We reviewed the characteristics, features and inheritance patterns of hereditary cancer syndromes. We also discussed genetic testing, including the appropriate family members to test, the process of testing, insurance coverage and turn-around-time for results. We discussed the implications of a negative, positive, carrier and/or variant of uncertain  significant result. Ms. Meine  was offered a common hereditary cancer panel (36+ genes) and an expanded pan-cancer panel (70+ genes). Ms. Lozito was informed of the benefits and limitations of each panel, including that expanded pan-cancer panels contain genes that do not have clear management guidelines at this point in  time.  We also discussed that as the number of genes included on a panel increases, the chances of variants of uncertain significance increases. Ms. Riederer decided to pursue genetic testing for the CancerNext-Expanded+RNAinsight gene panel.   The CancerNext-Expanded gene panel offered by [facility] and includes sequencing, rearrangement, and RNA analysis for the following 77 genes: AIP, ALK, APC, ATM, BAP1, BARD1, BMPR1A, BRCA1, BRCA2, BRIP1, CDC73, CDH1, CDK4, CDKN1B, CDKN2A, CEBPA, CHEK2, CTNNA1, DDX41, DICER1, ETV6, FH, FLCN, GATA2, LZTR1, MAX, MBD4, MEN1, MET, MLH1, MSH2, MSH3, MSH6, MUTYH, NF1, NF2, NTHL1, PALB2, PHOX2B, PMS2, POT1, PRKAR1A, PTCH1, PTEN, RAD51C, RAD51D, RB1, RET, RPS20, RUNX1, SDHA, SDHAF2, SDHB, SDHC, SDHD, SMAD4, SMARCA4, SMARCB1, SMARCE1, STK11, SUFU, TMEM127, TP53, TSC1, TSC2, VHL, and WT1 (sequencing and deletion/duplication); AXIN2, CTNNA1, DDX41, EGFR, HOXB13, KIT, MBD4, MITF, MSH3, PDGFRA, POLD1 and POLE (sequencing only); EPCAM and GREM1 (deletion/duplication only). RNA data is routinely analyzed for use in variant interpretation for all genes.   Based on Ms. Kundinger's personal and family history of cancer, she meets medical criteria for genetic testing. Despite that she meets criteria, she may still have an out of pocket cost. We discussed that if her out of pocket cost for testing is over $100, the laboratory will call and confirm whether she wants to proceed with testing.  If the out of pocket cost of testing is less than $100 she will be billed by the genetic testing laboratory.   We discussed that some people do not want to undergo genetic testing due to  fear of genetic discrimination.  The Genetic Information Nondiscrimination Act (GINA) was signed into federal law in 2008. GINA prohibits health insurers and most employers from discriminating against individuals based on genetic information (including the results of genetic tests and family history information). According to GINA, health insurance companies cannot consider genetic information to be a preexisting condition, nor can they use it to make decisions regarding coverage or rates. GINA also makes it illegal for most employers to use genetic information in making decisions about hiring, firing, promotion, or terms of employment. It is important to note that GINA does not offer protections for life insurance, disability insurance, or long-term care insurance. GINA does not apply to those in the [employer], those who work for companies with less than 15 employees, and new life insurance or long-term disability insurance policies.  Health status due to a cancer diagnosis is not protected under GINA. More information about GINA can be found by visiting eliteclients.be.  PLAN: After considering the risks, benefits, and limitations, Ms. Johndrow provided informed consent to pursue genetic testing and the blood sample was sent to [facility] for analysis of the CancerNext-Expanded+RNAinsight. Results should be available within approximately 2-3 weeks' time, at which point they will be disclosed by telephone to Ms. Pruett, as will any additional recommendations warranted by these results. Ms. Eddie will receive a summary of her genetic counseling visit and a copy of her results once available. This information will also be available in Epic.   Lastly, we encouraged Ms. Budzik to remain in contact with cancer genetics annually so that we can continuously update the family history and inform her of any changes in cancer genetics and testing that may be of benefit for this family.   Ms. Bezold  questions were answered to her satisfaction today. Our contact information was provided should additional questions or concerns arise. Thank you for the referral and allowing us  to share in the care of your patient.   Darice  MYRTIS Monte, MS, CGC Licensed, Certified Genetic Counselor [email or web address] phone: [phone]  I personally spent a total of 60 minutes in the care of the patient today including preparing to see the patient, getting/reviewing separately obtained history, counseling and educating, placing orders, and documenting clinical information in the EHR. The patient brought her husband. Drs. Lanny Stalls, and/or Gudena were available for questions, if needed..    _______________________________________________________________________ For Office Staff:  Number of people involved in session: 2 Was an Intern/ student involved with case: no

## 2024-06-10 ENCOUNTER — Inpatient Hospital Stay

## 2024-06-10 ENCOUNTER — Inpatient Hospital Stay: Admitting: Adult Health

## 2024-06-12 ENCOUNTER — Inpatient Hospital Stay

## 2024-06-12 ENCOUNTER — Encounter: Payer: Self-pay | Admitting: Genetic Counselor

## 2024-06-12 ENCOUNTER — Telehealth: Payer: Self-pay | Admitting: *Deleted

## 2024-06-12 DIAGNOSIS — Z1379 Encounter for other screening for genetic and chromosomal anomalies: Secondary | ICD-10-CM | POA: Insufficient documentation

## 2024-06-12 NOTE — Telephone Encounter (Signed)
 Received call from pt with complaint of soreness around port sight as well as an episode of chills last night and a rash on her back where her compression bra sits.  Pt denies fever, or redness/ warmth around port sight. Pt sates she did laundry yesterday evening and also states that her compression bra lays right over her port a cath.  RN educated pt to not wear compression bra today to see if symptoms resolve.  Pt states she sweats more with the compression bra.  RN educated pt that rash could also be related to compression bra and to avoid wearing it the next few days to see if symptoms resolve.  Pt educated to contact our office if symptoms become worse.  Pt verbalized understanding.

## 2024-06-12 NOTE — Telephone Encounter (Signed)
 Error

## 2024-06-13 ENCOUNTER — Inpatient Hospital Stay

## 2024-06-13 ENCOUNTER — Encounter: Payer: Self-pay | Admitting: Rehabilitation

## 2024-06-13 ENCOUNTER — Telehealth: Payer: Self-pay | Admitting: Genetic Counselor

## 2024-06-13 ENCOUNTER — Ambulatory Visit: Admitting: Rehabilitation

## 2024-06-13 DIAGNOSIS — Z9189 Other specified personal risk factors, not elsewhere classified: Secondary | ICD-10-CM

## 2024-06-13 DIAGNOSIS — Z170 Estrogen receptor positive status [ER+]: Secondary | ICD-10-CM

## 2024-06-13 DIAGNOSIS — M25611 Stiffness of right shoulder, not elsewhere classified: Secondary | ICD-10-CM

## 2024-06-13 DIAGNOSIS — R293 Abnormal posture: Secondary | ICD-10-CM

## 2024-06-13 DIAGNOSIS — Z483 Aftercare following surgery for neoplasm: Secondary | ICD-10-CM

## 2024-06-13 NOTE — Progress Notes (Signed)
 Nutrition Assessment   Reason for Assessment:  Referral regarding wt loss of 10 lb since Oct.  Tried protein drink    ASSESSMENT: 69 year old female with  Right breast cancer ER, PR positive HER2 positive. S/p right lumpectomy with 3/4 lymph nodes positive.  Receiving AC q 14 d and paclitaxel q 7 days.    Spoke with patient via phone.  Reports that appetite is good. Eating smaller meals more frequently.  Feels hungry.  Some dry mouth especially in the am, tongue soreness.  Typically eats eggs with toast or yogurt for breakfast. Mid am eats yogurt or chicken salad sandwich with mayo, around 1-2pm drinks protein shake (premier). Mid afternoon may have a Healthy Choice meal then supper is meat and couple of sides.  May have apples and peanut butter at bedtime.     Nutrition Focused Physical Exam:   Unable to perform phone visit   Medications: zofran , compazine , vit B 12, vit D   Labs: reviewed   Anthropometrics:   Height: 60 inches Weight: 154 lb (reports today) 160 lb 10/29 BMI: 29  3% weight loss in the last 3 months  Estimated Energy Needs  Kcals: 1750 Protein: 70-84 g Fluid:   NUTRITION DIAGNOSIS: Food and nutrition related knowledge deficit as evidenced by cancer diagnosis on chemotherapy with concerns about nutrition    INTERVENTION:  Discussed importance of healthy diet during chemotherapy and weight maintenance.   Discussed strategies to help with dry mouth (baking soda, salt water rinse, moist foods, fluids) Continue protein shakes Contact number provided    MONITORING, EVALUATION, GOAL: weight trends, intake   Next Visit: Tuesday, Feb 17 during infusion Barb  Marixa Mellott B. Dasie SOLON, CSO, LDN Registered Dietitian [phone]

## 2024-06-13 NOTE — Telephone Encounter (Signed)
LM on VM that results were back and to please call.  Left CB instructions. 

## 2024-06-13 NOTE — Therapy (Signed)
 " OUTPATIENT PHYSICAL THERAPY BREAST CANCER TREATMENT   Patient Name: Melanie Reese MRN: [id number] DOB:17-Mar-1956, 69 y.o., female Today's Date: 06/13/2024  END OF SESSION:  PT End of Session - 06/13/24 0908     Visit Number 8    Number of Visits 11    Date for Recertification  07/25/24    Authorization Type needed    PT Start Time 0909    PT Stop Time 0957    PT Time Calculation (min) 48 min    Activity Tolerance Patient tolerated treatment well    Behavior During Therapy [facility] for tasks assessed/performed             Past Medical History:  Diagnosis Date   Anemia    Arthritis    Basal cell carcinoma    on face   Cancer (HCC)    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 Last seizure was 1983 per patient.   Past Surgical History:  Procedure Laterality Date   AXILLARY LYMPH NODE DISSECTION Right 04/16/2024   Procedure: REDGIE HARD;  Surgeon: Aron Shoulders, MD;  Location: [facility] OR;  Service: General;  Laterality: Right;  RIGHT AXILLARY LYMPH NODE DISSECTION   BRAIN SURGERY     1975, 1976 plate in head and burrow from head injury   BREAST BIOPSY Right 02/22/2024   US  RT BREAST BX W LOC DEV 1ST LESION IMG BX SPEC US  GUIDE 02/22/2024 [facility] MAMMOGRAPHY   BREAST LUMPECTOMY Right 03/25/2024   Procedure: BREAST LUMPECTOMY;  Surgeon: Aron Shoulders, MD;  Location: [facility];  Service: General;  Laterality: Right;   COLONOSCOPY     DRUG INDUCED ENDOSCOPY     PORTACATH PLACEMENT N/A 05/09/2024   Procedure: INSERTION, TUNNELED CENTRAL VENOUS DEVICE, WITH PORT;  Surgeon: Aron Shoulders, MD;  Location: [facility] OR;  Service:  General;  Laterality: N/A;  PORT PLACEMENT WITH ULTRASOUND GUIDANCE   SENTINEL NODE BIOPSY Right 03/25/2024   Procedure: BIOPSY, LYMPH NODE, SENTINEL;  Surgeon: Aron Shoulders, MD;  Location: [facility];  Service: General;  Laterality: Right;  GEN w/PEC BLOCK RIGHT BREAST LUMPECTOMY RIGHT SENTINEL LYMPH NODE BIOPSY   Patient Active Problem List   Diagnosis Date Noted   Genetic testing 06/12/2024   Family history of ovarian cancer    Malignant neoplasm of upper-outer quadrant of right breast in female, estrogen receptor positive (HCC) 03/11/2024   Metatarsalgia of both feet 03/20/2017   Knee pain, bilateral 03/20/2017    PCP: Montie Pizza, MD   REFERRING PROVIDER: Shoulders Aron, MD   REFERRING DIAG: C50.411,Z17.0 (ICD-10-CM) - Malignant neoplasm of upper-outer quadrant of right breast in female, estrogen receptor positive (HCC)    THERAPY DIAG:  Abnormal posture  Malignant neoplasm of upper-outer quadrant of right breast in female, estrogen receptor positive (HCC)  Aftercare following surgery for neoplasm  At risk for lymphedema  Stiffness of right shoulder, not elsewhere classified  Rationale for Evaluation and Treatment: Rehabilitation  ONSET DATE: 02/25/2024   SUBJECTIVE:  SUBJECTIVE STATEMENT:   I've been a bit sick.  I feel like I'm a bit puffy in the breast and the upper arm for a couple of days.  The bras give me a rash I think.    PERTINENT HISTORY:  Patient was diagnosed on 02/25/2024 with right grade 2 invasive ductal carcinoma. It measures 2.6 cm and is located in the upper-outer quadrant. It is ER/PR +, HER2 - with a Ki67 of 30%.  She is s/p a right lumpectomy with SLNB on 03/25/2024 with 3+/4 LN's.  ALND on 04/16/2024 with 2/29 nodes positive.  Will be having  chemotherapy.     PATIENT GOALS:  Reassess how my recovery is going related to arm function, pain, and swelling.  PAIN:  Are you having pain? The Rt side just feels weird and tight.    PRECAUTIONS: Recent Surgery, right UE Lymphedema risk,   RED FLAGS: None   ACTIVITY LEVEL / LEISURE: most home activities except vaccuming   OBJECTIVE:   PATIENT SURVEYS:   QUICK DASH: 22% from 45% from 34% last visit  OBSERVATIONS: Bruising noted all around areola present just last 2 days; see photo in media  POSTURE:  Forward head and rounded shoulders posture   LYMPHEDEMA ASSESSMENT:  UPPER EXTREMITY AROM/PROM:   A/PROM RIGHT   eval   RIGHT 04/15/2024 05/05/24 05/28/24 06/13/24  Shoulder extension 63 52 45 55 60  Shoulder flexion 152 153 108 130 (back to baseline with AAROM) 140 (152 after stretching)   Shoulder abduction 145 148 90 - cording noted 125 (baseline with AAROM) 134 (145 after stretching)   Shoulder internal rotation 54 67     Shoulder external rotation 75 102  80                           (Blank rows = not tested)   A/PROM LEFT   eval  Shoulder extension 52  Shoulder flexion 152  Shoulder abduction 156  Shoulder internal rotation 61  Shoulder external rotation 81                          (Blank rows = not tested)   CERVICAL AROM: All within normal limits:      Percent limited  Flexion WFL  Extension WFL  Right lateral flexion WFL  Left lateral flexion WFL  Right rotation WFL  Left rotation WFL      LYMPHEDEMA ASSESSMENTS (in cm):    LANDMARK RIGHT   eval RIGHT 04/15/2024 05/05/24  10 cm proximal to olecranon process from proximal aspect of olecranon 30.1 30.3 31.2  Olecranon process 24.1 24.1 25  10  cm proximal to ulnar styloid process from proximal aspect of styloid process 21.7 22.2 21.9  Just distal to ulnar styloid process 15.4 15.3 15.6  Across hand at thumb web space 18.6 18.3   At base of 2nd digit 6.5 6.2 6.5  (Blank rows = not tested)    LANDMARK LEFT   eval  10 cm proximal to olecranon process from proximal aspect of olecranon 30.9   Olecranon process 23.8  10 cm proximal to ulnar styloid process from proximal aspect of styloid process 20.7  Just distal to ulnar styloid process 15.6  Across hand at thumb web space 17.5  At base of 2nd digit 6.1  (Blank rows = not tested)  Surgery type/Date: 03/25/2024 Right lumpectomy with SLNB Number of lymph nodes removed: 3+/4 LN's, pending  lymphadenectomy on 04/16/2024 Current/past treatment (chemo, radiation, hormone therapy):  Other symptoms:  Heaviness/tightness Yes Pain Yes Pitting edema No Infections No Decreased scar mobility Yes Stemmer sign No  TODAYS TREATMENT 06/13/24 Reassessed goals including AROM, QDASH and progress on getting a sleeve (pt will be going today after therapy) SOZO performed due to subjective feelings of swelling in the arm - this is only elevated by 1.7 and still in the green.   Breast was assessed due to feelings of puffiness on the lateral side - given a 1/2 foam piece, a spaghetti foam piece to try on the side in the bra and also a very small piece for the port.   Reviewe HEP/self care and pt is not really performing any specific exercises but just reaching for dishes and things so we talked about the importance of continuing to stretch.  Updated HEP and performed them per below.  After performing the new stretches she was demonstrating baseline AROM and was encouraged by this.   Discussed POC  05/28/24 Therapeutic Activity to improve reach  Pulleys into flex x 2 mins with VC's and demo to decrease Rt scapular compensation Roll yellow ball up wall into flex x 10 and abduction x 6 with initial demo Supine dowel flexion AAROM x 6 Then sitting up due to port discomfort  Switched to lawnchair position on table with more comfort.   90 shoulder circles c/cc x 10 each Manual Therapy P/ROM to Rt shoulder in supine into flex, abd and D2 with scapular  depression by therapist throughout  05/23/2024 Overhead pulleys flexion and scaption x 2 min ea Finger ladder into flexion and abduction x 4 each  PROM right shoulder flex, scaption, abd, ER MFR gently to multiple cords in axilla, less ttp today - normal pressure toelrated  PATIENT EDUCATION:  Education details: per today's note   Person educated: Patient Education method: Explanation, Demonstration, and Handouts Education comprehension: verbalized understanding  HOME EXERCISE PROGRAM: Reviewed previously given post op HEP. Access Code: [email or web address] URL: [email or web address] Date: 06/13/2024 Prepared by: Saddie Raw  Exercises - Seated Shoulder Flexion AAROM with Pulley Behind  - 1-2 x daily - 7 x weekly - 1 sets - 5 reps - 10sec hold - Seated Shoulder Scaption AAROM with Pulley at Side  - 1-2 x daily - 7 x weekly - 1 sets - 5 reps - 10sec hold - Standing Shoulder Flexion Wall Walk  - 1-2 x daily - 7 x weekly - 1 sets - 5 reps - 10sec hold - Standing Shoulder Abduction Finger Walk at Wall  - 1-2 x daily - 7 x weekly - 1 sets - 5 reps - 10sec hold    ASSESSMENT:  CLINICAL IMPRESSION: Goals were checked and POC was updated to include 2-3 more check ins to make sure her sleeve fits well and educate pt on this, and to make sure that her AROM is reached.  Overall goals have been partially met with just the AROM goal remaining.   Pt will benefit from skilled therapeutic intervention to improve on the following deficits: Decreased knowledge of precautions, impaired UE functional use, pain, decreased ROM, postural dysfunction.   PT treatment/interventions: ADL/Self care home management, [phone]- PT Re-evaluation, 97110-Therapeutic exercises, 97530- Therapeutic activity, [id number]- Neuromuscular re-education, 97535- Self Care, [id number]- Manual therapy, and Patient/Family education   GOALS: Goals reviewed with patient? Yes  GOALS MET AT EVAL:  GOALS Name Target Date Goal status  1  Pt will be able to verbalize understanding of pertinent lymphedema  risk reduction practices relevant to her dx specifically related to skin care.  Baseline:  No knowledge Eval Achieved at eval  2 Pt will be able to return demo and/or verbalize understanding of the post op HEP related to regaining shoulder ROM. Baseline:  No knowledge Eval Achieved at eval  3 Pt will be able to verbalize understanding of the importance of viewing the post op After Breast CA Class video for further lymphedema risk reduction education and therapeutic exercise.  Baseline:  No knowledge Eval Achieved at eval   LONG TERM GOALS:  (STG=LTG)  GOALS Name Target Date  Goal status  1 Pt will demonstrate she has regained full shoulder ROM and function post operatively compared to baselines.  Baseline: After next surgery on 04/16/2024 06/02/2024 ONGOING but improving   2 Pt will be fit for compression sleeve for prophylactic reasons 06/02/24 MET  3 Pt will have understanding of lymphedema precautions and will have all questions answered 06/02/24 MET  4        PLAN:  PT FREQUENCY/DURATION: 1x every 1.5-2 weeks x 6 weeks. Including therapeutic exercise, manual therapy, self care, therapeutic activities and re-eval.  Orthotic fit training as well.   PLAN FOR NEXT SESSION: cont Rt AAROM/PROM and cording (gentle to start)   [facility]  [address], [address]  [place] [address]  [phone]    Larue Saddie SAUNDERS, PT 06/13/2024, 10:33 AM  "

## 2024-06-15 ENCOUNTER — Other Ambulatory Visit: Payer: Self-pay

## 2024-06-16 ENCOUNTER — Telehealth: Payer: Self-pay | Admitting: Genetic Counselor

## 2024-06-16 ENCOUNTER — Encounter: Payer: Self-pay | Admitting: Hematology and Oncology

## 2024-06-16 ENCOUNTER — Ambulatory Visit: Payer: Self-pay | Admitting: Genetic Counselor

## 2024-06-16 ENCOUNTER — Encounter: Payer: Self-pay | Admitting: Genetic Counselor

## 2024-06-16 DIAGNOSIS — Z170 Estrogen receptor positive status [ER+]: Secondary | ICD-10-CM

## 2024-06-16 DIAGNOSIS — Z1379 Encounter for other screening for genetic and chromosomal anomalies: Secondary | ICD-10-CM

## 2024-06-17 ENCOUNTER — Inpatient Hospital Stay

## 2024-06-17 ENCOUNTER — Encounter: Payer: Self-pay | Admitting: *Deleted

## 2024-06-17 ENCOUNTER — Inpatient Hospital Stay: Admitting: Hematology and Oncology

## 2024-06-17 ENCOUNTER — Inpatient Hospital Stay: Attending: Hematology and Oncology

## 2024-06-17 VITALS — BP 122/78 | HR 96 | Temp 98.2°F | Resp 14 | Ht 60.0 in | Wt 153.4 lb

## 2024-06-17 VITALS — BP 125/78 | HR 91 | Temp 98.4°F | Resp 15

## 2024-06-17 DIAGNOSIS — C50411 Malignant neoplasm of upper-outer quadrant of right female breast: Secondary | ICD-10-CM

## 2024-06-17 DIAGNOSIS — Z170 Estrogen receptor positive status [ER+]: Secondary | ICD-10-CM

## 2024-06-17 LAB — CMP (CANCER CENTER ONLY)
ALT: 12 U/L (ref 0–44)
AST: 20 U/L (ref 15–41)
Albumin: 4.1 g/dL (ref 3.5–5.0)
Alkaline Phosphatase: 150 U/L — ABNORMAL HIGH (ref 38–126)
Anion gap: 9 (ref 5–15)
BUN: 7 mg/dL — ABNORMAL LOW (ref 8–23)
CO2: 26 mmol/L (ref 22–32)
Calcium: 9.1 mg/dL (ref 8.9–10.3)
Chloride: 106 mmol/L (ref 98–111)
Creatinine: 0.73 mg/dL (ref 0.44–1.00)
GFR, Estimated: >60 mL/min
Glucose, Bld: 101 mg/dL — ABNORMAL HIGH (ref 70–99)
Potassium: 3.9 mmol/L (ref 3.5–5.1)
Sodium: 141 mmol/L (ref 135–145)
Total Bilirubin: <0.2 mg/dL (ref 0.0–1.2)
Total Protein: 6.5 g/dL (ref 6.5–8.1)

## 2024-06-17 LAB — CBC WITH DIFFERENTIAL (CANCER CENTER ONLY)
Abs Immature Granulocytes: 4.82 10*3/uL — ABNORMAL HIGH (ref 0.00–0.07)
Basophils Absolute: 0.2 10*3/uL — ABNORMAL HIGH (ref 0.0–0.1)
Basophils Relative: 1 %
Eosinophils Absolute: 0 10*3/uL (ref 0.0–0.5)
Eosinophils Relative: 0 %
HCT: 28.5 % — ABNORMAL LOW (ref 36.0–46.0)
Hemoglobin: 9.6 g/dL — ABNORMAL LOW (ref 12.0–15.0)
Immature Granulocytes: 20 %
Lymphocytes Relative: 4 %
Lymphs Abs: 1 10*3/uL (ref 0.7–4.0)
MCH: 30.6 pg (ref 26.0–34.0)
MCHC: 33.7 g/dL (ref 30.0–36.0)
MCV: 90.8 fL (ref 80.0–100.0)
Monocytes Absolute: 2.2 10*3/uL — ABNORMAL HIGH (ref 0.1–1.0)
Monocytes Relative: 10 %
Neutro Abs: 15.4 10*3/uL — ABNORMAL HIGH (ref 1.7–7.7)
Neutrophils Relative %: 65 %
Platelet Count: 315 10*3/uL (ref 150–400)
RBC: 3.14 MIL/uL — ABNORMAL LOW (ref 3.87–5.11)
RDW: 12.4 % (ref 11.5–15.5)
Smear Review: NORMAL
WBC Count: 23.6 10*3/uL — ABNORMAL HIGH (ref 4.0–10.5)
nRBC: 0.3 % — ABNORMAL HIGH (ref 0.0–0.2)

## 2024-06-17 MED ORDER — APREPITANT 130 MG/18ML IV EMUL
130.0000 mg | Freq: Once | INTRAVENOUS | Status: AC
  Administered 2024-06-17: 130 mg via INTRAVENOUS
  Filled 2024-06-17: qty 18

## 2024-06-17 MED ORDER — SODIUM CHLORIDE 0.9 % IV SOLN
600.0000 mg/m2 | Freq: Once | INTRAVENOUS | Status: AC
  Administered 2024-06-17: 1000 mg via INTRAVENOUS
  Filled 2024-06-17: qty 50

## 2024-06-17 MED ORDER — DOXORUBICIN HCL CHEMO IV INJECTION 2 MG/ML
60.0000 mg/m2 | Freq: Once | INTRAVENOUS | Status: AC
  Administered 2024-06-17: 104 mg via INTRAVENOUS
  Filled 2024-06-17: qty 52

## 2024-06-17 MED ORDER — DEXAMETHASONE SOD PHOSPHATE PF 10 MG/ML IJ SOLN
10.0000 mg | Freq: Once | INTRAMUSCULAR | Status: AC
  Administered 2024-06-17: 10 mg via INTRAVENOUS
  Filled 2024-06-17: qty 1

## 2024-06-17 MED ORDER — PALONOSETRON HCL INJECTION 0.25 MG/5ML
0.2500 mg | Freq: Once | INTRAVENOUS | Status: AC
  Administered 2024-06-17: 0.25 mg via INTRAVENOUS
  Filled 2024-06-17: qty 5

## 2024-06-17 MED ORDER — SODIUM CHLORIDE 0.9 % IV SOLN: INTRAVENOUS | Status: DC

## 2024-06-17 NOTE — Progress Notes (Signed)
 "  Patient Care Team: Teresa Channel, MD as PCP - General (Family Medicine) Gerome, Devere HERO, RN as Registered Nurse Tyree Nanetta SAILOR, RN as Oncology Nurse Navigator Odean Potts, MD as Consulting Physician (Hematology and Oncology) Maritza Stagger, MD as Consulting Physician (Radiation Oncology) Aron Shoulders, MD as Consulting Physician (Surgical Oncology)  DIAGNOSIS:  Encounter Diagnosis  Name Primary?   Malignant neoplasm of upper-outer quadrant of right breast in female, estrogen receptor positive (HCC) Yes    SUMMARY OF ONCOLOGIC HISTORY: Oncology History  Malignant neoplasm of upper-outer quadrant of right breast in female, estrogen receptor positive (HCC)  02/22/2024 Initial Diagnosis   Patient had a palpable lump in the right breast measuring 2.6 cm by ultrasound biopsy revealed 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%, 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, axilla negative   03/12/2024 Cancer Staging   Staging form: Breast, AJCC 8th Edition - Clinical: Stage IB (cT2, cN0, cM0, G2, ER+, PR+, HER2-) - Signed by Odean Potts, MD on 03/12/2024 Stage prefix: Initial diagnosis Histologic grading system: 3 grade system   03/25/2024 Surgery   Right lumpectomy: 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, margins negative, 3/4 lymph nodes positive 04/16/2024: ALND: 2/29 lymph nodes positive (overall 5/33 lymph nodes)   04/29/2024 Cancer Staging   Staging form: Breast, AJCC 8th Edition - Pathologic stage from 04/29/2024: Stage IB (pT2, pN2, cM0, G2, ER+, PR+, HER2-) - Signed by Odean Potts, MD on 04/29/2024 Histologic grading system: 3 grade system   05/20/2024 -  Chemotherapy   Patient is on Treatment Plan : BREAST DOSE DENSE AC q14d / PACLitaxel q7d     06/12/2024 Genetic Testing   Negative genetic testing. The report date is June 12, 2024  The CancerNext-Expanded gene panel offered by [facility] and includes sequencing, rearrangement, and RNA analysis for the following 77 genes: AIP, ALK, APC, ATM, BAP1,  BARD1, BMPR1A, BRCA1, BRCA2, BRIP1, CDC73, CDH1, CDK4, CDKN1B, CDKN2A, CEBPA, CHEK2, CTNNA1, DDX41, DICER1, ETV6, FH, FLCN, GATA2, LZTR1, MAX, MBD4, MEN1, MET, MLH1, MSH2, MSH3, MSH6, MUTYH, NF1, NF2, NTHL1, PALB2, PHOX2B, PMS2, POT1, PRKAR1A, PTCH1, PTEN, RAD51C, RAD51D, RB1, RET, RPS20, RUNX1, SDHA, SDHAF2, SDHB, SDHC, SDHD, SMAD4, SMARCA4, SMARCB1, SMARCE1, STK11, SUFU, TMEM127, TP53, TSC1, TSC2, VHL, and WT1 (sequencing and deletion/duplication); AXIN2, CTNNA1, DDX41, EGFR, HOXB13, KIT, MBD4, MITF, MSH3, PDGFRA, POLD1 and POLE (sequencing only); EPCAM and GREM1 (deletion/duplication only). RNA data is routinely analyzed for use in variant interpretation for all genes.      CHIEF COMPLIANT: Cycle 3 dose dense Adriamycin  and Cytoxan   HISTORY OF PRESENT ILLNESS:  History of Present Illness Melanie Reese is a 69 year old female with stage IB, ER/PR-positive, HER2-negative, node-positive invasive breast cancer undergoing adjuvant chemotherapy who presents for follow-up of treatment response and chemotherapy-related toxicities.  She is receiving her third cycle of adjuvant doxorubicin  and cyclophosphamide  with curative intent after breast surgery and axillary lymph node dissection. She asked about staging after lymph node dissection. She has no new breast symptoms or abnormal bleeding.  She is tolerating chemotherapy without ongoing nausea after using a single antiemetic dose during cycle 1 and dietary changes since. She has significant xerostomia causing nocturnal awakenings despite hydration and a cold mist humidifier. She had three brief epistaxis episodes attributed to nasal dryness and nasal hair loss. She has persistent rhinorrhea and frequent sneezing that worsened after stopping loratadine and nasal spray due to stinging. She had severe bone pain after the first pegfilgrastim  injection that resolved and has no current bone pain.  She has increasing fatigue corresponding to a hemoglobin decline  from 12 to 9.6 g/dL during chemotherapy. She denies dyspnea, chest pain, or palpitations. She asked about feeling cold with lower hemoglobin and understands anemia may worsen with continued chemotherapy.  She developed an itchy rash on her upper back after the last chemotherapy cycle that improved with topical hydrocortisone. She has had similar minor rashes in the past without formal evaluation. She is monitoring for recurrence and will document worsening.  She asked about electrolyte drinks. She has no diarrhea and is not using electrolyte supplementation.      ALLERGIES:  is allergic to doxycycline, nsaids, chlorhexidine  gluconate, and sulfa antibiotics.  MEDICATIONS:  Current Outpatient Medications  Medication Sig Dispense Refill   acetaminophen  (TYLENOL ) 500 MG tablet Take 500 mg by mouth every 6 (six) hours as needed for moderate pain (pain score 4-6).     ALPRAZolam  (XANAX ) 0.25 MG tablet Take 1 tablet (0.25 mg total) by mouth daily as needed for anxiety. 30 tablet 0   Cholecalciferol 125 MCG (5000 UT) TABS Take 5,000 Units by mouth daily.     cyanocobalamin  2000 MCG tablet Take 2,000 mcg by mouth daily.     dexamethasone  (DECADRON ) 4 MG tablet Take 1 tablet day after chemo and 1 tablet 2 days after chemo with food 30 tablet 1   fluticasone (FLONASE) 50 MCG/ACT nasal spray Place 1 spray into both nostrils daily.     gabapentin  (NEURONTIN ) 100 MG capsule Take 100 mg by mouth 3 (three) times daily.     lidocaine -prilocaine  (EMLA ) cream Apply to affected area once 30 g 3   ondansetron  (ZOFRAN ) 8 MG tablet Take 1 tab (8 mg) by mouth every 8 hrs as needed for nausea/vomiting. Start third day after doxorubicin /cyclophosphamide  chemotherapy. 30 tablet 1   oxyCODONE  (OXY IR/ROXICODONE ) 5 MG immediate release tablet Take 1 tablet (5 mg total) by mouth every 6 (six) hours as needed for severe pain (pain score 7-10). 5 tablet 0   PHENobarbital  (LUMINAL) 97.2 MG tablet Take 97.2 mg by mouth at  bedtime.     prochlorperazine  (COMPAZINE ) 10 MG tablet Take 1 tablet (10 mg total) by mouth every 6 (six) hours as needed for nausea or vomiting. 30 tablet 1   tiZANidine (ZANAFLEX) 2 MG tablet Take 2 mg by mouth 3 (three) times daily.     triamterene-hydrochlorothiazide (MAXZIDE-25) 37.5-25 MG tablet Take 1 tablet by mouth daily as needed (swelling).     No current facility-administered medications for this visit.    PHYSICAL EXAMINATION: ECOG PERFORMANCE STATUS: 1 - Symptomatic but completely ambulatory  Vitals:   06/17/24 0908  BP: 122/78  Pulse: 96  Resp: 14  Temp: 98.2 F (36.8 C)  SpO2: 99%   Filed Weights   06/17/24 0908  Weight: 153 lb 6.4 oz (69.6 kg)    Physical Exam SKIN: Old rash on back, no active lesions.  (exam performed in the presence of a chaperone)  LABORATORY DATA:  I have reviewed the data as listed    Latest Ref Rng & Units 06/03/2024    9:34 AM 05/20/2024   11:19 AM 05/05/2024    3:00 PM  CMP  Glucose 70 - 99 mg/dL 895  877  894   BUN 8 - 23 mg/dL 12  11  12    Creatinine 0.44 - 1.00 mg/dL 9.19  9.20  9.16   Sodium 135 - 145 mmol/L 136  136  139   Potassium 3.5 - 5.1 mmol/L 3.6  4.1  4.3   Chloride 98 - 111 mmol/L 99  103  103   CO2 22 - 32 mmol/L 23  25  27    Calcium 8.9 - 10.3 mg/dL 9.1  9.0  9.2   Total Protein 6.5 - 8.1 g/dL 7.4  6.6    Total Bilirubin 0.0 - 1.2 mg/dL <9.7  0.2    Alkaline Phos 38 - 126 U/L 128  92    AST 15 - 41 U/L 27  24    ALT 0 - 44 U/L 31  15      Lab Results  Component Value Date   WBC 23.6 (H) 06/17/2024   HGB 9.6 (L) 06/17/2024   HCT 28.5 (L) 06/17/2024   MCV 90.8 06/17/2024   PLT 315 06/17/2024   NEUTROABS PENDING 06/17/2024    ASSESSMENT & PLAN:  Malignant neoplasm of upper-outer quadrant of right breast in female, estrogen receptor positive (HCC) 02/22/2024:Patient had a palpable lump in the right breast measuring 2.6 cm by ultrasound biopsy revealed 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%, 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,  axilla negative   03/25/2024: Right lumpectomy: 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, margins negative, 3/4 lymph nodes positive 04/16/2024: ALND: 2/29 lymph nodes positive (overall 5/33 lymph nodes)   Recommendations: 1. Adjuvant chemotherapy with dose dense Adriamycin  and Cytoxan  followed by Taxol 2. Adjuvant radiation therapy followed by 4. Adjuvant antiestrogen therapy -------------------------------------------------------------------------------------------------------------------------- Current treatment: Cycle 3 dose dense Adriamycin  and Cytoxan  Chemo toxicities: Rash on the back: Appears to have resolved with topical hydrocortisone cream Fatigue Hair loss Chemo-induced anemia: Today's hemoglobin is 9.6.  Watching closely.  Denied any nausea vomiting or mouth sores. Return to clinic in 2 weeks for cycle 4    No orders of the defined types were placed in this encounter.  The patient has a good understanding of the overall plan. she agrees with it. she will call with any problems that may develop before the next visit here.  I personally spent a total of 30 minutes in the care of the patient today including preparing to see the patient, getting/reviewing separately obtained history, performing a medically appropriate exam/evaluation, counseling and educating, placing orders, referring and communicating with other health care professionals, documenting clinical information in the EHR, independently interpreting results, communicating results, and coordinating care.   Dr.Makinzie Considine 06/17/24    "

## 2024-06-17 NOTE — Assessment & Plan Note (Signed)
 02/22/2024:Patient had a palpable lump in the right breast measuring 2.6 cm by ultrasound biopsy revealed 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%, 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, axilla negative   03/25/2024: Right lumpectomy: 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, margins negative, 3/4 lymph nodes positive 04/16/2024: ALND: 2/29 lymph nodes positive (overall 5/33 lymph nodes)   Recommendations: 1. Adjuvant chemotherapy with dose dense Adriamycin  and Cytoxan  followed by Taxol 2. Adjuvant radiation therapy followed by 4. Adjuvant antiestrogen therapy -------------------------------------------------------------------------------------------------------------------------- Current treatment: Cycle 3 dose dense Adriamycin  and Cytoxan  Chemo toxicities:  Return to clinic in 2 weeks for cycle 4

## 2024-06-17 NOTE — Patient Instructions (Signed)
 [facility] - A DEPT OF [facility]  Discharge Instructions: Thank you for choosing [facility] to provide your oncology and hematology care.   If you have a lab appointment with the Cancer Center, please go directly to the Cancer Center and check in at the registration area.   Wear comfortable clothing and clothing appropriate for easy access to any Portacath or PICC line.   We strive to give you quality time with your provider. You may need to reschedule your appointment if you arrive late (15 or more minutes).  Arriving late affects you and other patients whose appointments are after yours.  Also, if you miss three or more appointments without notifying the office, you may be dismissed from the clinic at the provider's discretion.      For prescription refill requests, have your pharmacy contact our office and allow 72 hours for refills to be completed.    Today you received the following chemotherapy and/or immunotherapy agents: Doxorubicin  (Adriamycin ) & Cyclophosphamide  (Cytoxan )     To help prevent nausea and vomiting after your treatment, we encourage you to take your nausea medication as directed.  BELOW ARE SYMPTOMS THAT SHOULD BE REPORTED IMMEDIATELY: *FEVER GREATER THAN 100.4 F (38 C) OR HIGHER *CHILLS OR SWEATING *NAUSEA AND VOMITING THAT IS NOT CONTROLLED WITH YOUR NAUSEA MEDICATION *UNUSUAL SHORTNESS OF BREATH *UNUSUAL BRUISING OR BLEEDING *URINARY PROBLEMS (pain or burning when urinating, or frequent urination) *BOWEL PROBLEMS (unusual diarrhea, constipation, pain near the anus) TENDERNESS IN MOUTH AND THROAT WITH OR WITHOUT PRESENCE OF ULCERS (sore throat, sores in mouth, or a toothache) UNUSUAL RASH, SWELLING OR PAIN  UNUSUAL VAGINAL DISCHARGE OR ITCHING   Items with * indicate a potential emergency and should be followed up as soon as possible or go to the Emergency Department if any problems should occur.  Please show  the CHEMOTHERAPY ALERT CARD or IMMUNOTHERAPY ALERT CARD at check-in to the Emergency Department and triage nurse.  Should you have questions after your visit or need to cancel or reschedule your appointment, please contact [facility] - A DEPT OF [facility]  Dept: [phone]  and follow the prompts.  Office hours are 8:00 a.m. to 4:30 p.m. Monday - Friday. Please note that voicemails left after 4:00 p.m. may not be returned until the following business day.  We are closed weekends and major holidays. You have access to a nurse at all times for urgent questions. Please call the main number to the clinic Dept: [phone] and follow the prompts.   For any non-urgent questions, you may also contact your provider using MyChart. We now offer e-Visits for anyone 110 and older to request care online for non-urgent symptoms. For details visit [email or web address].   Also download the MyChart app! Go to the app store, search MyChart, open the app, select [facility], and log in with your MyChart username and password.

## 2024-06-19 ENCOUNTER — Inpatient Hospital Stay

## 2024-06-19 VITALS — BP 113/67 | HR 90 | Temp 98.1°F | Resp 16

## 2024-06-19 DIAGNOSIS — C50411 Malignant neoplasm of upper-outer quadrant of right female breast: Secondary | ICD-10-CM

## 2024-06-19 MED ORDER — PEGFILGRASTIM-CBQV 6 MG/0.6ML ~~LOC~~ SOSY
6.0000 mg | PREFILLED_SYRINGE | Freq: Once | SUBCUTANEOUS | Status: AC
  Administered 2024-06-19: 6 mg via SUBCUTANEOUS
  Filled 2024-06-19: qty 0.6

## 2024-06-24 ENCOUNTER — Ambulatory Visit: Payer: Self-pay | Admitting: Rehabilitation

## 2024-07-01 ENCOUNTER — Inpatient Hospital Stay: Admitting: Hematology and Oncology

## 2024-07-01 ENCOUNTER — Inpatient Hospital Stay: Admitting: Nutrition

## 2024-07-01 ENCOUNTER — Inpatient Hospital Stay

## 2024-07-03 ENCOUNTER — Inpatient Hospital Stay

## 2024-07-07 ENCOUNTER — Ambulatory Visit: Payer: Self-pay | Admitting: Rehabilitation

## 2024-09-15 ENCOUNTER — Ambulatory Visit: Payer: Self-pay
